# Patient Record
Sex: Female | Born: 1965 | Race: White | Hispanic: No | Smoking: Never smoker
Health system: Southern US, Community
[De-identification: ages and names within clinical notes are randomized; demographics above are authoritative.]

## PROBLEM LIST (undated history)

## (undated) DIAGNOSIS — G8929 Other chronic pain: Secondary | ICD-10-CM

## (undated) DIAGNOSIS — M545 Low back pain, unspecified: Secondary | ICD-10-CM

## (undated) DIAGNOSIS — I10 Essential (primary) hypertension: Secondary | ICD-10-CM

## (undated) DIAGNOSIS — K219 Gastro-esophageal reflux disease without esophagitis: Secondary | ICD-10-CM

## (undated) DIAGNOSIS — R112 Nausea with vomiting, unspecified: Secondary | ICD-10-CM

## (undated) DIAGNOSIS — M069 Rheumatoid arthritis, unspecified: Secondary | ICD-10-CM

## (undated) DIAGNOSIS — G43909 Migraine, unspecified, not intractable, without status migrainosus: Secondary | ICD-10-CM

## (undated) DIAGNOSIS — F419 Anxiety disorder, unspecified: Secondary | ICD-10-CM

## (undated) DIAGNOSIS — Z9889 Other specified postprocedural states: Secondary | ICD-10-CM

## (undated) HISTORY — PX: GANGLION CYST EXCISION: SHX1691

## (undated) HISTORY — PX: BACK SURGERY: SHX140

## (undated) HISTORY — PX: LUMBAR DISC SURGERY: SHX700

## (undated) HISTORY — PX: TUMOR EXCISION: SHX421

---

## 1997-04-30 ENCOUNTER — Other Ambulatory Visit: Admission: RE | Admit: 1997-04-30 | Discharge: 1997-04-30 | Payer: Self-pay | Admitting: Obstetrics and Gynecology

## 1998-07-08 ENCOUNTER — Other Ambulatory Visit: Admission: RE | Admit: 1998-07-08 | Discharge: 1998-07-08 | Payer: Self-pay | Admitting: Obstetrics and Gynecology

## 1999-08-21 ENCOUNTER — Other Ambulatory Visit: Admission: RE | Admit: 1999-08-21 | Discharge: 1999-08-21 | Payer: Self-pay | Admitting: Obstetrics and Gynecology

## 2000-04-28 ENCOUNTER — Encounter: Admission: RE | Admit: 2000-04-28 | Discharge: 2000-04-28 | Payer: Self-pay | Admitting: Family Medicine

## 2000-04-28 ENCOUNTER — Encounter: Payer: Self-pay | Admitting: Family Medicine

## 2000-09-22 ENCOUNTER — Other Ambulatory Visit: Admission: RE | Admit: 2000-09-22 | Discharge: 2000-09-22 | Payer: Self-pay | Admitting: Obstetrics and Gynecology

## 2000-11-17 ENCOUNTER — Ambulatory Visit (HOSPITAL_COMMUNITY): Admission: RE | Admit: 2000-11-17 | Discharge: 2000-11-17 | Payer: Self-pay | Admitting: Obstetrics and Gynecology

## 2000-11-17 ENCOUNTER — Encounter: Payer: Self-pay | Admitting: Obstetrics and Gynecology

## 2001-01-18 HISTORY — PX: TUBAL LIGATION: SHX77

## 2001-01-20 ENCOUNTER — Encounter: Payer: Self-pay | Admitting: Obstetrics and Gynecology

## 2001-01-20 ENCOUNTER — Ambulatory Visit (HOSPITAL_COMMUNITY): Admission: RE | Admit: 2001-01-20 | Discharge: 2001-01-20 | Payer: Self-pay | Admitting: Obstetrics and Gynecology

## 2001-02-22 ENCOUNTER — Inpatient Hospital Stay (HOSPITAL_COMMUNITY): Admission: AD | Admit: 2001-02-22 | Discharge: 2001-02-22 | Payer: Self-pay | Admitting: Obstetrics and Gynecology

## 2001-02-24 ENCOUNTER — Inpatient Hospital Stay (HOSPITAL_COMMUNITY): Admission: AD | Admit: 2001-02-24 | Discharge: 2001-02-27 | Payer: Self-pay | Admitting: Obstetrics & Gynecology

## 2001-02-24 ENCOUNTER — Encounter (INDEPENDENT_AMBULATORY_CARE_PROVIDER_SITE_OTHER): Payer: Self-pay | Admitting: Specialist

## 2001-03-31 ENCOUNTER — Ambulatory Visit (HOSPITAL_COMMUNITY): Admission: RE | Admit: 2001-03-31 | Discharge: 2001-03-31 | Payer: Self-pay | Admitting: Obstetrics & Gynecology

## 2001-08-15 ENCOUNTER — Ambulatory Visit: Admission: RE | Admit: 2001-08-15 | Discharge: 2001-08-15 | Payer: Self-pay | Admitting: Neurosurgery

## 2001-08-16 ENCOUNTER — Emergency Department (HOSPITAL_COMMUNITY): Admission: EM | Admit: 2001-08-16 | Discharge: 2001-08-16 | Payer: Self-pay | Admitting: Emergency Medicine

## 2001-08-30 ENCOUNTER — Encounter: Payer: Self-pay | Admitting: Neurosurgery

## 2001-08-30 ENCOUNTER — Inpatient Hospital Stay (HOSPITAL_COMMUNITY): Admission: RE | Admit: 2001-08-30 | Discharge: 2001-09-02 | Payer: Self-pay | Admitting: Neurosurgery

## 2001-09-27 ENCOUNTER — Encounter: Admission: RE | Admit: 2001-09-27 | Discharge: 2001-09-27 | Payer: Self-pay | Admitting: Neurosurgery

## 2001-09-27 ENCOUNTER — Encounter: Payer: Self-pay | Admitting: Neurosurgery

## 2001-11-15 ENCOUNTER — Encounter: Admission: RE | Admit: 2001-11-15 | Discharge: 2001-11-15 | Payer: Self-pay | Admitting: Neurosurgery

## 2001-11-15 ENCOUNTER — Encounter: Payer: Self-pay | Admitting: Neurosurgery

## 2003-05-30 ENCOUNTER — Encounter: Admission: RE | Admit: 2003-05-30 | Discharge: 2003-05-30 | Payer: Self-pay | Admitting: Neurosurgery

## 2003-10-04 ENCOUNTER — Emergency Department (HOSPITAL_COMMUNITY): Admission: EM | Admit: 2003-10-04 | Discharge: 2003-10-04 | Payer: Self-pay | Admitting: Emergency Medicine

## 2004-03-19 ENCOUNTER — Other Ambulatory Visit: Admission: RE | Admit: 2004-03-19 | Discharge: 2004-03-19 | Payer: Self-pay | Admitting: Obstetrics & Gynecology

## 2006-07-29 ENCOUNTER — Ambulatory Visit (HOSPITAL_COMMUNITY): Admission: RE | Admit: 2006-07-29 | Discharge: 2006-07-29 | Payer: Self-pay | Admitting: Surgery

## 2006-07-29 ENCOUNTER — Encounter (INDEPENDENT_AMBULATORY_CARE_PROVIDER_SITE_OTHER): Payer: Self-pay | Admitting: Surgery

## 2007-03-10 ENCOUNTER — Emergency Department (HOSPITAL_COMMUNITY): Admission: EM | Admit: 2007-03-10 | Discharge: 2007-03-10 | Payer: Self-pay | Admitting: Emergency Medicine

## 2007-07-07 ENCOUNTER — Ambulatory Visit (HOSPITAL_COMMUNITY): Admission: RE | Admit: 2007-07-07 | Discharge: 2007-07-07 | Payer: Self-pay | Admitting: Cardiovascular Disease

## 2007-10-22 ENCOUNTER — Encounter: Admission: RE | Admit: 2007-10-22 | Discharge: 2007-10-22 | Payer: Self-pay | Admitting: Neurosurgery

## 2009-01-16 ENCOUNTER — Encounter: Admission: RE | Admit: 2009-01-16 | Discharge: 2009-01-16 | Payer: Self-pay | Admitting: Obstetrics & Gynecology

## 2010-02-08 ENCOUNTER — Encounter: Payer: Self-pay | Admitting: Obstetrics & Gynecology

## 2010-02-08 ENCOUNTER — Encounter: Payer: Self-pay | Admitting: Neurosurgery

## 2010-06-02 NOTE — Op Note (Signed)
NAME:  Brandy Shepherd, Brandy Shepherd            ACCOUNT NO.:  [id number]   MEDICAL RECORD NO.:  [id number]          PATIENT TYPE:  AMB   LOCATION:  DAY                          FACILITY:  [facility]   PHYSICIAN:  Thomas A. Cornett, M.D.DATE OF BIRTH:  04-Jun-1965   DATE OF PROCEDURE:  07/29/2006  DATE OF DISCHARGE:                               OPERATIVE REPORT   PREOPERATIVE DIAGNOSIS:  Left axillary lipoma.   POSTOPERATIVE DIAGNOSIS:  Left axillary lipoma.   PROCEDURE:  Excision of deep left axillary lipoma measuring 6 x 60 cm.   SURGEON:  Harriette Bouillon, M.D.   ANESTHESIA:  LMA with approximately 20 mL of 0.25% Sensorcaine.   ESTIMATED BLOOD LOSS:  20 mL.   SPECIMEN:  Left axillary lipoma to pathology.   DRAINS:  None.   INDICATIONS FOR PROCEDURE:  The patient is a 45 year old female with  large left axillary lipoma.  She presents today for excision due to  discomfort.   DESCRIPTION OF PROCEDURE:  The patient brought to the operating room,  placed supine.  After induction of general anesthesia, left axilla was  prepped and draped in a sterile fashion.  A incision was made over the  roughly 6 x 6 cm lipoma.  We excised down, found a large, lobulated  fatty mass consistent with lipoma.  This excised.  It was in the deep  tissues.  We then passed it off the field.  We closed the wound in  layers using a deep layer of 3-0 Vicryl and 4-0 Monocryl in subcuticular  fashion.  Dermabond was used for dressing.  All final counts of sponge,  needle and instruments was found be correct for this portion of the  case.  The patient was then awoke, taken to recovery in satisfactory  condition.      Thomas A. Cornett, M.D.  Electronically Signed     TAC/MEDQ  D:  07/29/2006  T:  07/30/2006  Job:  [id number]

## 2010-06-05 NOTE — Op Note (Signed)
NAME:  Brandy Shepherd, Brandy Shepherd                          ACCOUNT NO.:  [id number]   MEDICAL RECORD NO.:  [id number]                   PATIENT TYPE:  INP   LOCATION:  3002                                 FACILITY:  [facility]   PHYSICIAN:  Donzetta Sprung. Wynetta Emery, M.D.                  DATE OF BIRTH:  March 04, 1965   DATE OF PROCEDURE:  08/30/2001  DATE OF DISCHARGE:                                 OPERATIVE REPORT   PREOPERATIVE DIAGNOSES:  L5-S1 recurrent ruptured disk and diskogenic  mechanical instability.   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   OPERATION PERFORMED:  Redo decompressive laminectomy, L5-S1.  Posterior  lumbar interbody fusion, L5-S1 using 10 x 24 mm tangent allograft wedges,  posterolateral arthrodesis using locally harvested autograft.  Pedicle screw  fixation, L5-S1.   SURGEON:  Donzetta Sprung. Wynetta Emery, M.D.   ASSISTANT:  Reinaldo Meeker, M.D.   ANESTHESIA:  General endotracheal.   INDICATIONS FOR PROCEDURE:  The patient is a very pleasant 45 year old  female who has had longstanding back and left leg pain with numbness and  tingling in the left foot.  The patient has had previous laminectomy at L5-  S1 for disk, did well for a period of time postoperatively.  Over the last  several months has had progressively worsening left leg pain radiating to  the S1 distribution with back pain that was mechanical in nature worse when  she went from a lying position to the sitting position.  Previous __________  revealed ruptured disk and degenerated disk at L5-S1.  Patient has failed  conservative treatment and was recommended decompressive laminectomy and  fusion.  Discussed and explained the risks and benefits of surgery with her.  She understands and agreed to proceed.   DESCRIPTION OF PROCEDURE:  Patient placed supine, induced under general  anesthesia __________.  Back was prepped and draped in the usual sterile  fashion.  Her old  incision was opened up.  Bovie electrocautery was used to  __________ subperiosteal dissection carried out to lamina.  Intraoperative x-  ray confirmed observation of pedicles of L4 and L5.  The incision was  extended inferiorly and again the exposure was extended laterally, exposing  the TPs of L5 and S1.  Self-retaining retractor was placed.  Then using  Leksell rongeur spinous process of L5 was removed and complete laminectomy  and medial facetectomy was performed on the right side at L5-S1, as well as  removing the ligamentum flavum in piecemeal fashion exposing the thecal sac  and the right S1 nerve root.  Disk space was coagulated.  Attention taken to  freeing up the scar tissue leftward.  Using a 4 Penfield scar tissue was  freed up in the central lamina and laminectomy was completed, medial  facetectomy was completed on the patient's left side.  There was a moderate  amount of scar tissue  superiorly.  The disk space appeared to be competent  and __________  very large disk rupture appreciated on the left S1 nerve  root.  This was teased away with a 4 Penfield and the foramen of the S1  nerve root was opened up widely.  Then using a D'Errico nerve root  retractor, the S1 nerve root was reflected medially and annulotomy was made  and fibrinous tissue removed from the left side.  This was cleaned out.  An  11 distractor was placed.  Fluoroscopy confirmed this to be a good size and  10 x 24 mm tangent allograft wedges were selected.  Then with the 11  distractor in place on the left side, the right disk space was cleaned out.  Annulotomy was made.  Disk was adequately cleaned out.  Endplates were  scraped down.  Then using a size 10 cutter and chisel down the disk space  was prepared to receive the allograft after the chisel was inserted and the  end plates were prepared.  Fluoroscopy confirmed depth.  A 10 x 24 mm  tangent allograft was inserted on the patient's right side approximately  10  mm posterior vertebral __________ .  Fluoroscopy confirmed good position.  The distractor was removed.  The interspace was cleaned out from the left  side.  Again cut with chisel with a 10 cutter and chisel.  Central end  plates were scraped off with a 0 Epstein curet.  Then local autograft was  packed against the allograft on the right side and a 10 x 24 mm tangent  allograft was inserted on the patient's left side.   Then attention was taken __________ .  Using fluoroscopy, the pedicle was  identified and also on direct inspection with an angled __________ ,  identifying the medial and inferior borders of the pedicle.  Pedicle holes  were cannulated with a high speed drill, then cannulated further with the  awl, tapped with a 5/5 tap and six 5 x 45 pedicle screws inserted at L5  bilaterally.  Fluoroscopy confirming depth and trajectory.  Then at S1 this  procedure was repeated with six 5 x 35 pedicle screws inserted at S1.  All  pedicles were probed on 360-degree orientation at each step along the way  and noted to be competent and all nerve roots and pedicles were probed from  within the canal and noted to have no violation.  Then the wound was  copiously irrigated.  Decortication was proceeded along __________ of L5 and  S1 medial and lateral facet complexes.  The remainder of the locally  harvested autograft was packed in the lateral gutters.  A 40 mm rod was  selected and inserted tightened down at S1 compressed from L5 down to S1 and  then final tightened there.  Then again meticulous hemostasis was obtained.  Copious irrigation was performed.  Gelfoam was laid atop of the dura.  Fascia was reapproximated after a medium Hemovac drain was placed with 0  interrupted Vicryl.  Subcutaneous tissues closed with two interrupted  Vicryl.  Skin closed with running 4-0 subcuticular __________ Steri-Strips applied.  The patient was taken to the recovery room in stable condition.  At the  end of the case, needle count and sponge count correct.  Jillyn Hidden P. Wynetta Emery, M.D.    GPC/MEDQ  D:  08/30/2001  T:  09/01/2001  Job:  [id number]

## 2010-06-05 NOTE — Discharge Summary (Signed)
   NAME:  SAVINA, OLSHEFSKI                          ACCOUNT NO.:  [id number]   MEDICAL RECORD NO.:  [id number]                   PATIENT TYPE:  INP   LOCATION:  3002                                 FACILITY:  [facility]   PHYSICIAN:  Donzetta Sprung. Wynetta Emery, M.D.                  DATE OF BIRTH:  06/17/1965   DATE OF ADMISSION:  08/30/2001  DATE OF DISCHARGE:  09/02/2001                                 DISCHARGE SUMMARY   ADMITTING DIAGNOSIS:  Recurrent ruptured disk and ____________ Campylobacter  in L5-S1.   PROCEDURE:  Decompressive laminectomy redo and lumbar fusion L5-S1.   SURGEON:  Donzetta Sprung. Wynetta Emery, M.D.   ASSISTANT:  ____________ .   HISTORY OF PRESENT ILLNESS AND HOSPITAL COURSE:  The patient is a very  pleasant 45 year old female who was admitted to the hospital and seen me and  underwent the aforementioned procedure.  Postop the patient did very well on  the floor was afebrile with stable vital signs mobilizing on the first day  with physical therapy, remained afebrile, Hemovac was taken out on day #2,  did well with physical therapy, Foley was taken out and the patient was  voiding spontaneously at the time of discharge.  Postop day #3 the patient  was ambulating and voiding spontaneously, wound was clean and dry, leg pain  was gone and her incision was clean and dry and so she was discharged with  scheduled followup 2 weeks.                                               Jillyn Hidden P. Wynetta Emery, M.D.    GPC/MEDQ  D:  10/19/2001  T:  10/19/2001  Job:  [id number]

## 2010-06-05 NOTE — Op Note (Signed)
[facility]  Patient:    Brandy Shepherd, Brandy Shepherd Visit Number: [id number] MRN: [id number]          Service Type: DSU Location: [facility] Attending Physician:  Mickle Mallory Dictated by:   Gerrit Friends. Aldona Bar, M.D. Proc. Date: 03/31/01 Admit Date:  03/31/2001                             Operative Report  PREOPERATIVE DIAGNOSIS:       Desire for permanent elective sterilization.  POSTOPERATIVE DIAGNOSIS:      Desire for permanent elective sterilization.  PROCEDURE:                    Fiberscopic tubal coagulation for permanent elective sterilization.  SURGEON:                      Gerrit Friends. Aldona Bar, M.D.  ANESTHESIA:                   General endotracheal.  HISTORY:                      This 45 year old who delivered approximately five weeks ago antenatally requested permanent elective sterilization procedure.  When she was delivered, she had been induced for mild preeclampsia, and it was felt best, because of her persistently elevated blood pressures postpartum, not to carry out her tubal sterilization procedure at that time.  She returned to the office for her follow-up checkup approximately one week ago, and she was still interested in such procedure.  She is taken to the operating room now according to her wishes for such procedure.  She is aware that this procedure is permanent but, unfortunately, is not absolutely perfect, and subsequent pregnancy can result.  According to her wishes, she is now taken to the operating room for such procedure.  DESCRIPTION OF PROCEDURE:     The patient was taken to the operating room where, after the satisfactory induction of general endotracheal anesthesia, she was prepped and draped, having been placed in the short Allen stirrups in the modified lithotomy position.  She was prepped and draped in the usual fashion.  The bladder was drained of clear urine with a red rubber catheter in in-and-out fashion.  A Hulka  tenaculum was placed on the cervix for uterine manipulation during the procedure.  At this time, laparoscopy was begun.  A 1 cm subumbilical transverse skin incision was made and, with minimal difficulty, the large trocar and sleeve were introduced.  The large trocar was removed and a laparoscope was placed through the large sleeve, with good visualization of the intra-abdominal structures.  At this time, pneumoperitoneum was created with approximately 3 L of carbon dioxide gas.  At this time, through a 5 mm suprapubic midline transverse skin incision, an accessory trocar and sleeve were introduced without difficulty under direct visualization.  The accessory trocar was removed and, through the accessory sleeve, the bipolar coagulating instrument, appropriately tested, was introduced.  The right fallopian tube was identified, traced out to the fimbriated end with positive identification and, in the midportion of the right fallopian tube, an area measuring approximately 2 cm was adequately coagulated.  A similar procedure was carried out on the left fallopian tube. Both ovaries appeared normal, as did the uterus, cul-de-sac and dome of the bladder.   The appendix was not visualized secondary to being covered by omentum.  The abdomen in general appeared normal.  The liver edge was not visualized well, again because of the omentum covering over this area.  Once the tubes had been adequately coagulated, the procedure was felt to be complete and was terminated.  The accessory sleeve was removed under direct visualization after the bipolar coagulating instrument was removed.  At this time, pneumoperitoneum was reduced and the large sleeve was removed.  The skin incisions were closed with 4-0 Vicryl in an interrupted subcuticular fashion. Band-aids were applied.  The Hulka tenaculum was removed from the cervix and the patient at this time was transported to the recovery room in  satisfactory condition, having tolerated the procedure well.  ESTIMATED BLOOD LOSS:         Negligible.  COUNTS:                       Correct x2.  PATHOLOGIC SPECIMENS:         None.  DISPOSITION:                  The patient will be observed and discharged to home.  She will be given an instruction sheet at the time of discharge with all specific instructions enumerated.  She will be given a prescription for Tylox to use 1-2 every 4-6 hours as needed for severe pain and Compazine rectal suppositories, 25 mg, one per rectum every 6-8 hours as needed for severe nausea and vomiting.  CONDITION ON ARRIVAL IN THE RECOVERY ROOM:         Satisfactory. Dictated by:   Gerrit Friends. Aldona Bar, M.D. Attending Physician:  Mickle Mallory DD:  03/31/01 TD:  04/01/01 Job: [id number]

## 2010-09-23 ENCOUNTER — Other Ambulatory Visit: Payer: Self-pay | Admitting: Obstetrics & Gynecology

## 2010-09-29 ENCOUNTER — Other Ambulatory Visit: Payer: Self-pay | Admitting: Obstetrics & Gynecology

## 2010-09-29 DIAGNOSIS — R928 Other abnormal and inconclusive findings on diagnostic imaging of breast: Secondary | ICD-10-CM

## 2010-10-05 ENCOUNTER — Ambulatory Visit
Admission: RE | Admit: 2010-10-05 | Discharge: 2010-10-05 | Disposition: A | Discharge status: Home / Self Care | Payer: 59 | Source: Ambulatory Visit | Attending: Obstetrics & Gynecology | Admitting: Obstetrics & Gynecology

## 2010-10-05 DIAGNOSIS — R928 Other abnormal and inconclusive findings on diagnostic imaging of breast: Secondary | ICD-10-CM

## 2010-10-09 LAB — DIFFERENTIAL
Basophils Relative: 1
Eosinophils Absolute: 0.1
Eosinophils Relative: 1
Lymphocytes Relative: 26
Lymphs Abs: 2.4

## 2010-10-09 LAB — I-STAT 8, (EC8 V) (CONVERTED LAB)
Acid-base deficit: 3 — ABNORMAL HIGH
BUN: 7
HCT: 45
Hemoglobin: 15.3 — ABNORMAL HIGH
Operator id: 288331
Potassium: 4.2
pH, Ven: 7.348 — ABNORMAL HIGH

## 2010-10-09 LAB — CBC
HCT: 39.3
MCHC: 34.4
MCV: 89.8
Platelets: 285
RDW: 13.2

## 2010-10-09 LAB — POCT CARDIAC MARKERS
Myoglobin, poc: 313
Troponin i, poc: 0.05

## 2010-10-09 LAB — POCT I-STAT CREATININE
Creatinine, Ser: 1
Operator id: 288331

## 2010-11-03 LAB — CBC
Hemoglobin: 13.7
MCV: 87.9
Platelets: 292
RBC: 4.54
RDW: 13.3
WBC: 7.6

## 2010-11-03 LAB — DIFFERENTIAL
Basophils Absolute: 0.1
Eosinophils Absolute: 0.1
Eosinophils Relative: 1
Lymphocytes Relative: 38
Monocytes Relative: 7

## 2010-11-03 LAB — BASIC METABOLIC PANEL
Creatinine, Ser: 0.65
Glucose, Bld: 92

## 2011-09-03 ENCOUNTER — Ambulatory Visit
Admission: RE | Admit: 2011-09-03 | Discharge: 2011-09-03 | Disposition: A | Discharge status: Home / Self Care | Payer: 59 | Source: Ambulatory Visit | Attending: Orthopedic Surgery | Admitting: Orthopedic Surgery

## 2011-09-03 ENCOUNTER — Other Ambulatory Visit: Payer: Self-pay | Admitting: Orthopedic Surgery

## 2011-09-03 DIAGNOSIS — S62102A Fracture of unspecified carpal bone, left wrist, initial encounter for closed fracture: Secondary | ICD-10-CM

## 2012-12-01 ENCOUNTER — Other Ambulatory Visit: Payer: Self-pay | Admitting: Family

## 2012-12-01 DIAGNOSIS — I728 Aneurysm of other specified arteries: Secondary | ICD-10-CM

## 2012-12-20 ENCOUNTER — Ambulatory Visit
Admission: RE | Admit: 2012-12-20 | Discharge: 2012-12-20 | Disposition: A | Discharge status: Home / Self Care | Payer: 59 | Source: Ambulatory Visit | Attending: Family | Admitting: Family

## 2012-12-20 DIAGNOSIS — I728 Aneurysm of other specified arteries: Secondary | ICD-10-CM

## 2012-12-20 HISTORY — DX: Gastro-esophageal reflux disease without esophagitis: K21.9

## 2012-12-20 HISTORY — DX: Essential (primary) hypertension: I10

## 2012-12-22 ENCOUNTER — Other Ambulatory Visit (HOSPITAL_COMMUNITY): Payer: Self-pay | Admitting: Interventional Radiology

## 2012-12-22 DIAGNOSIS — I728 Aneurysm of other specified arteries: Secondary | ICD-10-CM

## 2012-12-27 ENCOUNTER — Telehealth: Payer: Self-pay | Admitting: Emergency Medicine

## 2012-12-27 ENCOUNTER — Other Ambulatory Visit (HOSPITAL_COMMUNITY): Payer: Self-pay | Admitting: Radiology

## 2012-12-27 ENCOUNTER — Encounter: Payer: Self-pay | Admitting: Emergency Medicine

## 2012-12-27 NOTE — Telephone Encounter (Signed)
Pt called and needed letter for work for her procedure on 01-05-13. Faxed to [phone]

## 2013-01-05 ENCOUNTER — Encounter (HOSPITAL_COMMUNITY): Payer: 59 | Admitting: Anesthesiology

## 2013-01-05 ENCOUNTER — Other Ambulatory Visit (HOSPITAL_COMMUNITY): Payer: Self-pay | Admitting: Interventional Radiology

## 2013-01-05 ENCOUNTER — Encounter (HOSPITAL_COMMUNITY): Payer: Self-pay

## 2013-01-05 ENCOUNTER — Ambulatory Visit (HOSPITAL_COMMUNITY)
Admission: RE | Admit: 2013-01-05 | Discharge: 2013-01-06 | Disposition: A | Discharge status: Home / Self Care | Payer: 59 | Source: Ambulatory Visit | Attending: Interventional Radiology | Admitting: Interventional Radiology

## 2013-01-05 ENCOUNTER — Encounter (HOSPITAL_COMMUNITY): Payer: Self-pay | Admitting: Anesthesiology

## 2013-01-05 VITALS — BP 123/66 | HR 88 | Temp 97.7°F | Resp 18 | Ht 67.5 in | Wt 270.6 lb

## 2013-01-05 DIAGNOSIS — I728 Aneurysm of other specified arteries: Secondary | ICD-10-CM

## 2013-01-05 DIAGNOSIS — R112 Nausea with vomiting, unspecified: Secondary | ICD-10-CM | POA: Insufficient documentation

## 2013-01-05 DIAGNOSIS — K219 Gastro-esophageal reflux disease without esophagitis: Secondary | ICD-10-CM | POA: Insufficient documentation

## 2013-01-05 DIAGNOSIS — I10 Essential (primary) hypertension: Secondary | ICD-10-CM | POA: Insufficient documentation

## 2013-01-05 DIAGNOSIS — F411 Generalized anxiety disorder: Secondary | ICD-10-CM | POA: Insufficient documentation

## 2013-01-05 DIAGNOSIS — M069 Rheumatoid arthritis, unspecified: Secondary | ICD-10-CM | POA: Insufficient documentation

## 2013-01-05 HISTORY — PX: ANEURYSM COILING: SHX5349

## 2013-01-05 HISTORY — DX: Other specified postprocedural states: Z98.890

## 2013-01-05 HISTORY — DX: Migraine, unspecified, not intractable, without status migrainosus: G43.909

## 2013-01-05 HISTORY — DX: Low back pain: M54.5

## 2013-01-05 HISTORY — DX: Rheumatoid arthritis, unspecified: M06.9

## 2013-01-05 HISTORY — DX: Low back pain, unspecified: M54.50

## 2013-01-05 HISTORY — DX: Other chronic pain: G89.29

## 2013-01-05 HISTORY — DX: Other specified postprocedural states: R11.2

## 2013-01-05 HISTORY — DX: Anxiety disorder, unspecified: F41.9

## 2013-01-05 LAB — PREPARE RBC (CROSSMATCH)

## 2013-01-05 LAB — CBC WITH DIFFERENTIAL/PLATELET
Basophils Relative: 1 % (ref 0–1)
Eosinophils Relative: 4 % (ref 0–5)
Hemoglobin: 12.8 g/dL (ref 12.0–15.0)
Lymphocytes Relative: 50 % — ABNORMAL HIGH (ref 12–46)
Lymphs Abs: 4 10*3/uL (ref 0.7–4.0)
MCHC: 34.3 g/dL (ref 30.0–36.0)
Monocytes Relative: 9 % (ref 3–12)
Neutro Abs: 2.9 10*3/uL (ref 1.7–7.7)
Neutrophils Relative %: 37 % — ABNORMAL LOW (ref 43–77)
RBC: 3.91 MIL/uL (ref 3.87–5.11)
WBC: 8 10*3/uL (ref 4.0–10.5)

## 2013-01-05 LAB — ABO/RH: ABO/RH(D): A POS

## 2013-01-05 LAB — BASIC METABOLIC PANEL
Creatinine, Ser: 0.98 mg/dL (ref 0.50–1.10)
GFR calc Af Amer: 80 mL/min — ABNORMAL LOW (ref >90)
GFR calc non Af Amer: 69 mL/min — ABNORMAL LOW (ref >90)
Potassium: 3.6 mEq/L (ref 3.5–5.1)
Sodium: 136 mEq/L (ref 135–145)

## 2013-01-05 LAB — APTT: aPTT: 30 seconds (ref 24–37)

## 2013-01-05 LAB — PROTIME-INR: INR: 0.98 (ref 0.00–1.49)

## 2013-01-05 MED ORDER — FENTANYL CITRATE 0.05 MG/ML IJ SOLN
INTRAMUSCULAR | Status: AC
  Filled 2013-01-05: qty 4

## 2013-01-05 MED ORDER — ONDANSETRON HCL 4 MG/2ML IJ SOLN: 4.0000 mg | Freq: Three times a day (TID) | INTRAMUSCULAR | Status: DC | PRN

## 2013-01-05 MED ORDER — SODIUM CHLORIDE 0.9 % IV BOLUS (SEPSIS)
INTRAVENOUS | Status: AC | PRN
  Administered 2013-01-05: 250 mL via INTRAVENOUS

## 2013-01-05 MED ORDER — ONDANSETRON HCL 4 MG/2ML IJ SOLN
INTRAMUSCULAR | Status: AC
  Filled 2013-01-05: qty 2

## 2013-01-05 MED ORDER — SUCCINYLCHOLINE CHLORIDE 20 MG/ML IJ SOLN
INTRAMUSCULAR | Status: DC | PRN
  Administered 2013-01-05: 120 mg via INTRAVENOUS

## 2013-01-05 MED ORDER — MIDAZOLAM HCL 2 MG/2ML IJ SOLN
INTRAMUSCULAR | Status: AC
  Filled 2013-01-05: qty 2

## 2013-01-05 MED ORDER — HEPARIN SODIUM (PORCINE) 1000 UNIT/ML IJ SOLN
INTRAMUSCULAR | Status: DC | PRN
  Administered 2013-01-05: 3000 [IU] via INTRAVENOUS

## 2013-01-05 MED ORDER — PROMETHAZINE HCL 25 MG PO TABS: 25.0000 mg | ORAL_TABLET | Freq: Four times a day (QID) | ORAL | Status: DC | PRN

## 2013-01-05 MED ORDER — LISINOPRIL-HYDROCHLOROTHIAZIDE 10-12.5 MG PO TABS: 1.0000 | ORAL_TABLET | Freq: Every day | ORAL | Status: DC

## 2013-01-05 MED ORDER — DIPHENHYDRAMINE HCL 12.5 MG/5ML PO ELIX
12.5000 mg | ORAL_SOLUTION | Freq: Four times a day (QID) | ORAL | Status: DC | PRN
  Filled 2013-01-05: qty 5

## 2013-01-05 MED ORDER — DEXTROSE 5 % IV SOLN
3.0000 g | Freq: Once | INTRAVENOUS | Status: AC
  Administered 2013-01-05: 3 g via INTRAVENOUS
  Filled 2013-01-05: qty 3000

## 2013-01-05 MED ORDER — SODIUM CHLORIDE 0.9 % IV SOLN
INTRAVENOUS | Status: AC
  Administered 2013-01-05 – 2013-01-06 (×2): via INTRAVENOUS

## 2013-01-05 MED ORDER — LACTATED RINGERS IV SOLN
INTRAVENOUS | Status: DC | PRN
  Administered 2013-01-05 (×2): via INTRAVENOUS

## 2013-01-05 MED ORDER — HYDROMORPHONE HCL PF 1 MG/ML IJ SOLN
INTRAMUSCULAR | Status: AC
  Filled 2013-01-05: qty 2

## 2013-01-05 MED ORDER — PROPOFOL 10 MG/ML IV BOLUS
INTRAVENOUS | Status: DC | PRN
  Administered 2013-01-05: 170 mg via INTRAVENOUS

## 2013-01-05 MED ORDER — PROMETHAZINE HCL 25 MG RE SUPP: 25.0000 mg | Freq: Three times a day (TID) | RECTAL | Status: DC | PRN

## 2013-01-05 MED ORDER — DEXAMETHASONE SODIUM PHOSPHATE 4 MG/ML IJ SOLN
INTRAMUSCULAR | Status: DC | PRN
  Administered 2013-01-05: 8 mg via INTRAVENOUS

## 2013-01-05 MED ORDER — SODIUM CHLORIDE 0.9 % IJ SOLN: 9.0000 mL | INTRAMUSCULAR | Status: DC | PRN

## 2013-01-05 MED ORDER — MENINGOCOCCAL A C Y&W-135 OLIG IM SOLR: 0.5000 mL | Freq: Once | INTRAMUSCULAR | Status: DC

## 2013-01-05 MED ORDER — HYDROCHLOROTHIAZIDE 12.5 MG PO CAPS
12.5000 mg | ORAL_CAPSULE | Freq: Every day | ORAL | Status: DC
  Administered 2013-01-05 – 2013-01-06 (×2): 12.5 mg via ORAL
  Filled 2013-01-05 (×2): qty 1

## 2013-01-05 MED ORDER — PROPOFOL INFUSION 10 MG/ML OPTIME
INTRAVENOUS | Status: DC | PRN
  Administered 2013-01-05: 75 ug/kg/min via INTRAVENOUS

## 2013-01-05 MED ORDER — SODIUM CHLORIDE 0.9 % IV SOLN
Freq: Once | INTRAVENOUS | Status: AC
  Administered 2013-01-05: 07:00:00 via INTRAVENOUS

## 2013-01-05 MED ORDER — ROCURONIUM BROMIDE 100 MG/10ML IV SOLN
INTRAVENOUS | Status: DC | PRN
  Administered 2013-01-05: 20 mg via INTRAVENOUS

## 2013-01-05 MED ORDER — PROMETHAZINE HCL 25 MG PO TABS: 25.0000 mg | ORAL_TABLET | Freq: Three times a day (TID) | ORAL | Status: DC | PRN

## 2013-01-05 MED ORDER — LISINOPRIL 10 MG PO TABS
10.0000 mg | ORAL_TABLET | Freq: Every day | ORAL | Status: DC
  Administered 2013-01-06: 10 mg via ORAL
  Filled 2013-01-05 (×2): qty 1

## 2013-01-05 MED ORDER — TAPENTADOL HCL ER 250 MG PO TB12: 250.0000 mg | ORAL_TABLET | Freq: Two times a day (BID) | ORAL | Status: DC

## 2013-01-05 MED ORDER — FENTANYL CITRATE 0.05 MG/ML IJ SOLN
INTRAMUSCULAR | Status: DC | PRN
  Administered 2013-01-05 (×2): 50 ug via INTRAVENOUS

## 2013-01-05 MED ORDER — IOHEXOL 300 MG/ML  SOLN
150.0000 mL | Freq: Once | INTRAMUSCULAR | Status: AC | PRN
  Administered 2013-01-05: 180 mL via INTRA_ARTERIAL

## 2013-01-05 MED ORDER — METOCLOPRAMIDE HCL 10 MG PO TABS: 10.0000 mg | ORAL_TABLET | Freq: Three times a day (TID) | ORAL | Status: DC | PRN

## 2013-01-05 MED ORDER — OXYCODONE HCL ER 40 MG PO T12A
40.0000 mg | EXTENDED_RELEASE_TABLET | Freq: Two times a day (BID) | ORAL | Status: DC
  Administered 2013-01-05 – 2013-01-06 (×2): 40 mg via ORAL
  Filled 2013-01-05 (×2): qty 1

## 2013-01-05 MED ORDER — PROMETHAZINE HCL 25 MG PO TABS
25.0000 mg | ORAL_TABLET | Freq: Four times a day (QID) | ORAL | Status: DC | PRN
  Administered 2013-01-05 – 2013-01-06 (×2): 25 mg via ORAL
  Filled 2013-01-05 (×2): qty 1

## 2013-01-05 MED ORDER — HEPARIN SODIUM (PORCINE) 1000 UNIT/ML IJ SOLN
INTRAMUSCULAR | Status: AC
  Filled 2013-01-05: qty 1

## 2013-01-05 MED ORDER — METRONIDAZOLE IN NACL 5-0.79 MG/ML-% IV SOLN
500.0000 mg | Freq: Three times a day (TID) | INTRAVENOUS | Status: DC
  Filled 2013-01-05 (×3): qty 100

## 2013-01-05 MED ORDER — MIDAZOLAM HCL 2 MG/2ML IJ SOLN
INTRAMUSCULAR | Status: AC
  Filled 2013-01-05: qty 4

## 2013-01-05 MED ORDER — HYDROMORPHONE HCL PF 1 MG/ML IJ SOLN
INTRAMUSCULAR | Status: AC
  Filled 2013-01-05: qty 3

## 2013-01-05 MED ORDER — PANTOPRAZOLE SODIUM 40 MG PO TBEC
40.0000 mg | DELAYED_RELEASE_TABLET | Freq: Every day | ORAL | Status: DC
  Administered 2013-01-05 – 2013-01-06 (×2): 40 mg via ORAL
  Filled 2013-01-05 (×2): qty 1

## 2013-01-05 MED ORDER — DOCUSATE SODIUM 100 MG PO CAPS: 100.0000 mg | ORAL_CAPSULE | Freq: Two times a day (BID) | ORAL | Status: DC

## 2013-01-05 MED ORDER — METHOTREXATE 2.5 MG PO TABS: 10.0000 mg | ORAL_TABLET | ORAL | Status: DC

## 2013-01-05 MED ORDER — ENOXAPARIN SODIUM 40 MG/0.4ML ~~LOC~~ SOLN
40.0000 mg | SUBCUTANEOUS | Status: DC
  Administered 2013-01-05: 40 mg via SUBCUTANEOUS
  Filled 2013-01-05 (×2): qty 0.4

## 2013-01-05 MED ORDER — SODIUM CHLORIDE 0.9 % IV SOLN: INTRAVENOUS | Status: DC

## 2013-01-05 MED ORDER — KETOROLAC TROMETHAMINE 30 MG/ML IJ SOLN: 30.0000 mg | Freq: Four times a day (QID) | INTRAMUSCULAR | Status: DC

## 2013-01-05 MED ORDER — ARTIFICIAL TEARS OP OINT
TOPICAL_OINTMENT | OPHTHALMIC | Status: DC | PRN
  Administered 2013-01-05: 1 via OPHTHALMIC

## 2013-01-05 MED ORDER — GLYCOPYRROLATE 0.2 MG/ML IJ SOLN
INTRAMUSCULAR | Status: DC | PRN
  Administered 2013-01-05: 0.4 mg via INTRAVENOUS

## 2013-01-05 MED ORDER — DOCUSATE SODIUM 100 MG PO CAPS
100.0000 mg | ORAL_CAPSULE | Freq: Two times a day (BID) | ORAL | Status: DC
  Administered 2013-01-05 – 2013-01-06 (×2): 100 mg via ORAL
  Filled 2013-01-05 (×3): qty 1

## 2013-01-05 MED ORDER — HYDROMORPHONE HCL PF 1 MG/ML IJ SOLN
0.2500 mg | INTRAMUSCULAR | Status: DC | PRN
  Administered 2013-01-05 (×2): 0.5 mg via INTRAVENOUS

## 2013-01-05 MED ORDER — MIDAZOLAM HCL 2 MG/2ML IJ SOLN
INTRAMUSCULAR | Status: AC | PRN
  Administered 2013-01-05 (×3): 1 mg via INTRAVENOUS
  Administered 2013-01-05 (×2): 2 mg via INTRAVENOUS
  Administered 2013-01-05 (×3): 1 mg via INTRAVENOUS

## 2013-01-05 MED ORDER — ONDANSETRON 8 MG/NS 50 ML IVPB
8.0000 mg | Freq: Once | INTRAVENOUS | Status: AC
  Administered 2013-01-05: 8 mg via INTRAVENOUS
  Filled 2013-01-05 (×2): qty 8

## 2013-01-05 MED ORDER — ONDANSETRON HCL 4 MG/2ML IJ SOLN
INTRAMUSCULAR | Status: AC | PRN
  Administered 2013-01-05: 4 mg via INTRAVENOUS

## 2013-01-05 MED ORDER — FENTANYL CITRATE 0.05 MG/ML IJ SOLN
INTRAMUSCULAR | Status: AC
  Filled 2013-01-05: qty 2

## 2013-01-05 MED ORDER — HYDROMORPHONE 0.3 MG/ML IV SOLN
INTRAVENOUS | Status: AC
  Filled 2013-01-05: qty 25

## 2013-01-05 MED ORDER — HAEMOPHILUS B POLYSAC CONJ VAC IM SOLR
0.5000 mL | Freq: Once | INTRAMUSCULAR | Status: AC
  Administered 2013-01-06: 0.5 mL via INTRAMUSCULAR
  Filled 2013-01-05: qty 0.5

## 2013-01-05 MED ORDER — MIDAZOLAM HCL 2 MG/2ML IJ SOLN
INTRAMUSCULAR | Status: AC
  Filled 2013-01-05: qty 6

## 2013-01-05 MED ORDER — HYDROMORPHONE 0.3 MG/ML IV SOLN
INTRAVENOUS | Status: DC
  Administered 2013-01-05 – 2013-01-06 (×2): via INTRAVENOUS
  Filled 2013-01-05 (×2): qty 25

## 2013-01-05 MED ORDER — ALBUMIN HUMAN 5 % IV SOLN
INTRAVENOUS | Status: DC | PRN
  Administered 2013-01-05: 12:00:00 via INTRAVENOUS

## 2013-01-05 MED ORDER — PIPERACILLIN-TAZOBACTAM 3.375 G IVPB
3.3750 g | Freq: Three times a day (TID) | INTRAVENOUS | Status: DC
  Administered 2013-01-05 – 2013-01-06 (×3): 3.375 g via INTRAVENOUS
  Filled 2013-01-05 (×5): qty 50

## 2013-01-05 MED ORDER — FOLIC ACID 1 MG PO TABS
1.0000 mg | ORAL_TABLET | Freq: Every day | ORAL | Status: DC
  Administered 2013-01-05 – 2013-01-06 (×2): 1 mg via ORAL
  Filled 2013-01-05 (×2): qty 1

## 2013-01-05 MED ORDER — HYDROMORPHONE HCL PF 1 MG/ML IJ SOLN
INTRAMUSCULAR | Status: AC | PRN
  Administered 2013-01-05 (×4): 1 mg

## 2013-01-05 MED ORDER — FENTANYL CITRATE 0.05 MG/ML IJ SOLN
INTRAMUSCULAR | Status: AC | PRN
  Administered 2013-01-05 (×6): 50 ug via INTRAVENOUS

## 2013-01-05 MED ORDER — LIDOCAINE HCL (CARDIAC) 20 MG/ML IV SOLN
INTRAVENOUS | Status: DC | PRN
  Administered 2013-01-05: 50 mg via INTRAVENOUS

## 2013-01-05 MED ORDER — NALOXONE HCL 0.4 MG/ML IJ SOLN: 0.4000 mg | INTRAMUSCULAR | Status: DC | PRN

## 2013-01-05 MED ORDER — KETOROLAC TROMETHAMINE 30 MG/ML IJ SOLN
30.0000 mg | Freq: Four times a day (QID) | INTRAMUSCULAR | Status: DC
  Administered 2013-01-05 – 2013-01-06 (×3): 30 mg via INTRAVENOUS
  Filled 2013-01-05 (×7): qty 1

## 2013-01-05 MED ORDER — NEOSTIGMINE METHYLSULFATE 1 MG/ML IJ SOLN
INTRAMUSCULAR | Status: DC | PRN
  Administered 2013-01-05: 3 mg via INTRAVENOUS

## 2013-01-05 MED ORDER — DIPHENHYDRAMINE HCL 50 MG/ML IJ SOLN: 12.5000 mg | Freq: Four times a day (QID) | INTRAMUSCULAR | Status: DC | PRN

## 2013-01-05 MED ORDER — SODIUM CHLORIDE 0.9 % IV SOLN
INTRAVENOUS | Status: DC | PRN
  Administered 2013-01-05: 11:00:00 via INTRAVENOUS

## 2013-01-05 MED ORDER — FLUOXETINE HCL 20 MG PO CAPS
40.0000 mg | ORAL_CAPSULE | Freq: Every day | ORAL | Status: DC
  Administered 2013-01-06: 40 mg via ORAL
  Filled 2013-01-05 (×2): qty 2

## 2013-01-05 NOTE — Progress Notes (Signed)
Transferred to 6 E 12 . Report to Sharen Heck RN

## 2013-01-05 NOTE — ED Notes (Signed)
Pt c/o of back and abd pain 10/10. Additional meds given and sponge placed underneath lower back

## 2013-01-05 NOTE — Progress Notes (Signed)
Foley d/c. Patient tolerated well.

## 2013-01-05 NOTE — Procedures (Signed)
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   Complications: Discomfort during procedure requiring anesthesia assistance for sedation.  Recommendations: - Bedrest - Pain control and hydration until tolerating PO - IV abx - Vaccinate for encapsulated organisms   Signed,  Sterling Big, MD [facility]

## 2013-01-05 NOTE — Progress Notes (Signed)
Interventional Radiology Post Op Note  Date: 01/05/13 HPI: 47 yo female with enlarging distal splenic artery aneurysm now POD#0 s/p sandwich coil embolization of the aneurysm and distal splenic artery. Subjective:  Recovered well from anesthesia.  She continues to have back pain and mild LUQ pain.  Denies current N/V or severe pain.  Groin is asymptomatic.  No fever or chills.  Objective: Filed Vitals:   01/05/13 1709  BP: 127/66  Pulse: 97  Temp: 98.7 F (37.1 C)  Resp: 20   Abd: Soft, minimally TTP LUQ, no peritoneal signs Right groin: Soft, minimally TTP, no hematoma, dressing C/D/I Pulses: Intact and symmetric distally  Assessment & Plan: Doing well POD#0 s/p distal coil embolization of splenic artery. - Bedrest over at 19:00, may then DC foley and AAT - ADAT - Continue dilaudid PCA tonight, attempt to convert to oral regimen tomorrow - Toradol Q6hrs as ordered - SCDs now, lovenox at 22:00 - IV zosyn until D/C - Meningococcal and H. Flu vaccines ordered for am - Labs in am - I am optimistic she may be ready for DC tomorrow - Pt to be DC'd on Levaquin 500 mg Daily x 7 days - F/U with Dr. Archer Asa in 2 weeks  Signed,  Sterling Big, MD [facility] [phone]

## 2013-01-05 NOTE — ED Notes (Signed)
Logan CRNA assumed care of pt

## 2013-01-05 NOTE — Anesthesia Preprocedure Evaluation (Addendum)
Anesthesia Evaluation  Patient identified by MRN, date of birth, ID band Patient awake and Patient confused    Reviewed: Allergy & Precautions, H&P , NPO status , Patient's Chart, lab work & pertinent test results, reviewed documented beta blocker date and time   History of Anesthesia Complications (+) PONV  Airway Mallampati: II TM Distance: >3 FB Neck ROM: Full  Mouth opening: Limited Mouth Opening  Dental  (+) Teeth Intact   Pulmonary    Pulmonary exam normal       Cardiovascular hypertension,     Neuro/Psych  Headaches,    GI/Hepatic GERD-  ,  Endo/Other    Renal/GU      Musculoskeletal   Abdominal Normal abdominal exam  (+)   Peds  Hematology   Anesthesia Other Findings   Reproductive/Obstetrics                          Anesthesia Physical Anesthesia Plan  ASA: III  Anesthesia Plan: MAC and General   Post-op Pain Management:    Induction: Intravenous  Airway Management Planned: Mask and Oral ETT  Additional Equipment:   Intra-op Plan:   Post-operative Plan: Extubation in OR  Informed Consent:   Dental advisory given  Plan Discussed with: CRNA, Anesthesiologist and Surgeon  Anesthesia Plan Comments: (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

## 2013-01-05 NOTE — Transfer of Care (Signed)
Immediate Anesthesia Transfer of Care Note  Patient: Brandy Shepherd  Procedure(s) Performed: * No procedures listed *  Patient Location: PACU  Anesthesia Type:General  Level of Consciousness: awake, alert  and oriented  Airway & Oxygen Therapy: Patient Spontanous Breathing and Patient connected to face mask oxygen  Post-op Assessment: Report given to PACU RN  Post vital signs: Reviewed and stable  Complications: No apparent anesthesia complications

## 2013-01-05 NOTE — Anesthesia Postprocedure Evaluation (Signed)
  Anesthesia Post-op Note  Patient: Brandy Shepherd  Procedure(s) Performed: * No procedures listed *  Patient Location: PACU  Anesthesia Type:General  Level of Consciousness: awake  Airway and Oxygen Therapy: Patient Spontanous Breathing  Post-op Pain: mild  Post-op Assessment: Post-op Vital signs reviewed  Post-op Vital Signs: Reviewed  Complications: No apparent anesthesia complications

## 2013-01-05 NOTE — H&P (Signed)
Brandy Shepherd is an 47 y.o. female.   Chief Complaint: Pt has suffered nausea/vomiting and abd pain x 2 months. Work up with CTA revealed coincidental splenic artery aneurysm. This finding was compared to previous CT (56yrs ago) and aneurysm was noted. Splenic artery aneurysm has enlarged in that time frame. Pt was consulted with Brandy Shepherd and has opted for embolization. Scheduled now for same.  HPI: HTN; GERD; Rheumatoid arthritis  Past Medical History  Diagnosis Date  . Hypertension   . GERD (gastroesophageal reflux disease)   . Headache(784.0) 12/20/2012    migraines when younger; none in years  . Arthritis   . PONV (postoperative nausea and vomiting)     Past Surgical History  Procedure Laterality Date  . Tubal ligation  2003  . Back surgery  1997, 2003    No family history on file. Social History:  has no tobacco, alcohol, and drug history on file.  Allergies:  Allergies  Allergen Reactions  . Peach [Prunus Persica] Itching and Rash     (Not in a hospital admission)  Results for orders placed during the hospital encounter of 01/05/13 (from the past 48 hour(s))  APTT     Status: None   Collection Time    01/05/13  6:51 AM      Result Value Range   aPTT 30  24 - 37 seconds  BASIC METABOLIC PANEL     Status: Abnormal   Collection Time    01/05/13  6:51 AM      Result Value Range   Sodium 136  135 - 145 mEq/L   Potassium 3.6  3.5 - 5.1 mEq/L   Chloride 103  96 - 112 mEq/L   CO2 24  19 - 32 mEq/L   Glucose, Bld 92  70 - 99 mg/dL   BUN 12  6 - 23 mg/dL   Creatinine, Ser 1.61  0.50 - 1.10 mg/dL   Calcium 9.3  8.4 - 09.6 mg/dL   GFR calc non Af Amer 69 (*) >90 mL/min   GFR calc Af Amer 80 (*) >90 mL/min   Comment: (NOTE)     The eGFR has been calculated using the CKD EPI equation.     This calculation has not been validated in all clinical situations.     eGFR's persistently <90 mL/min signify possible Chronic Kidney     Disease.  CBC WITH  DIFFERENTIAL     Status: Abnormal   Collection Time    01/05/13  6:51 AM      Result Value Range   WBC 8.0  4.0 - 10.5 K/uL   RBC 3.91  3.87 - 5.11 MIL/uL   Hemoglobin 12.8  12.0 - 15.0 g/dL   HCT 04.5  40.9 - 81.1 %   MCV 95.4  78.0 - 100.0 fL   MCH 32.7  26.0 - 34.0 pg   MCHC 34.3  30.0 - 36.0 g/dL   RDW 91.4  78.2 - 95.6 %   Platelets 248  150 - 400 K/uL   Neutrophils Relative % 37 (*) 43 - 77 %   Neutro Abs 2.9  1.7 - 7.7 K/uL   Lymphocytes Relative 50 (*) 12 - 46 %   Lymphs Abs 4.0  0.7 - 4.0 K/uL   Monocytes Relative 9  3 - 12 %   Monocytes Absolute 0.7  0.1 - 1.0 K/uL   Eosinophils Relative 4  0 - 5 %   Eosinophils Absolute 0.3  0.0 - 0.7  K/uL   Basophils Relative 1  0 - 1 %   Basophils Absolute 0.1  0.0 - 0.1 K/uL  PROTIME-INR     Status: None   Collection Time    01/05/13  6:51 AM      Result Value Range   Prothrombin Time 12.8  11.6 - 15.2 seconds   INR 0.98  0.00 - 1.49   No results found.  Review of Systems  Constitutional: Negative for fever and chills.  Respiratory: Negative for sputum production.   Cardiovascular: Negative for chest pain.  Gastrointestinal: Positive for nausea, vomiting and abdominal pain.  Neurological: Positive for weakness. Negative for headaches.  Psychiatric/Behavioral: Negative for substance abuse.    Blood pressure 166/63, pulse 90, temperature 98 F (36.7 C), temperature source Oral, resp. rate 18, height 5' 7.5" (1.715 m), weight 260 lb (117.935 kg), last menstrual period 01/02/2013, SpO2 97.00%. Physical Exam  Constitutional: She is oriented to person, place, and time. She appears well-developed and well-nourished.  Cardiovascular: Normal rate, regular rhythm and normal heart sounds.   No murmur heard. Respiratory: Effort normal and breath sounds normal. She has no wheezes.  GI: Soft. Bowel sounds are normal. There is tenderness.  Musculoskeletal: Normal range of motion.  Neurological: She is alert and oriented to person,  place, and time. Coordination normal.  Skin: Skin is warm and dry.  Psychiatric: She has a normal mood and affect. Her behavior is normal. Judgment and thought content normal.     Assessment/Plan Splenic artery aneurysm- enlarging per CT Now scheduled for aneurysm embolization Pt and family aware of procedure benefits and risks and agreeable to proceed Consent signed and in chart Pt is aware she may be admitted after procedure of necessary.  Brandy Shepherd A 01/05/2013, 8:04 AM

## 2013-01-05 NOTE — ED Notes (Signed)
TC to anesthesia to see if they are available to assume care of the pt as moderate sedation is not sufficient. Awaiting return TC

## 2013-01-06 LAB — CBC
Hemoglobin: 11.6 g/dL — ABNORMAL LOW (ref 12.0–15.0)
MCHC: 33.5 g/dL (ref 30.0–36.0)
Platelets: 268 10*3/uL (ref 150–400)
RBC: 3.6 MIL/uL — ABNORMAL LOW (ref 3.87–5.11)
RDW: 13 % (ref 11.5–15.5)
WBC: 22 10*3/uL — ABNORMAL HIGH (ref 4.0–10.5)

## 2013-01-06 LAB — BASIC METABOLIC PANEL
CO2: 24 mEq/L (ref 19–32)
Chloride: 102 mEq/L (ref 96–112)
Creatinine, Ser: 1.02 mg/dL (ref 0.50–1.10)
GFR calc Af Amer: 76 mL/min — ABNORMAL LOW (ref >90)
GFR calc non Af Amer: 65 mL/min — ABNORMAL LOW (ref >90)
Potassium: 4.1 mEq/L (ref 3.5–5.1)
Sodium: 135 mEq/L (ref 135–145)

## 2013-01-06 LAB — POCT I-STAT 4, (NA,K, GLUC, HGB,HCT)
Glucose, Bld: 98 mg/dL (ref 70–99)
Hemoglobin: 11.9 g/dL — ABNORMAL LOW (ref 12.0–15.0)
Potassium: 3.8 mEq/L (ref 3.5–5.1)
Sodium: 137 mEq/L (ref 135–145)

## 2013-01-06 MED ORDER — KETOROLAC TROMETHAMINE 10 MG PO TABS
10.0000 mg | ORAL_TABLET | Freq: Four times a day (QID) | ORAL | Status: DC
  Administered 2013-01-06: 10 mg via ORAL
  Filled 2013-01-06 (×4): qty 1

## 2013-01-06 NOTE — Progress Notes (Signed)
Patient discharged to home. Patient AVS reviewed. Patient verbalized understanding of incision care, medications and follow-up appointments.  Patient remains stable; no signs or symptoms of distress.  Educated to return to the ER in cases of SOB, dizziness, fever, chest pain, or fainting.

## 2013-01-06 NOTE — Progress Notes (Signed)
Remainder of Patient's Dilaudid PCA, a total of 22.5 mL, was wasted. Waste was witnessed by myself and [employer], Charity fundraiser.

## 2013-01-06 NOTE — Discharge Summary (Signed)
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 perfusion of the spleen given her immunocompromised status (RA medications). As a last resort, a sandwich coiling of the distal splenic artery can be performed although this will result in some loss of splenic tissue. If this happens, she will require approximately 2 weeks of antibiotic therapy as well as vaccination against encapsulated organisms to decrease the  risk of splenic abscess formation and overwhelming sepsis. We will try to avoid devascularization of the spleen if at all possible. 2. Hypertension - recently diagnosed and undergoing management with hydrochlorothiazide per her primary care physician -continue aggressive blood pressure management, particularly until her aneurysm Brandy successfully excluded 3. Chronic right lower quadrant abdominal pain, nausea, vomiting and weight loss - I do not think the symptoms are related the patient's splenic artery aneurysm. I think this Brandy an incidental but important finding. - I encouraged her and feel that her appointment with gastroenterology later this month will prove fruitful.  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   Electronically Signed   By: Malachy Moan M.D.   On: 12/21/2012 13:03    Discharge Exam: Blood pressure 123/66, pulse 88, temperature 97.7 F (36.5 C), temperature source Oral, resp. rate 18, height 5' 7.5" (1.715 m), weight 270 lb 9.6 oz (122.743 kg), last menstrual period 12/27/2012, SpO2 100.00%. The patient Brandy awake, alert and oriented. Chest Brandy clear to auscultation bilaterally. Heart with regular rate and rhythm. Abdomen obese, soft, mild/moderate tenderness in the left upper quadrant. Minimal tenderness in the right lower quadrant, positive bowel sounds. Puncture site right common femoral artery soft, intact clean bandage, no definite hematoma, mildly tender to palpation. Extremities with full range of motion, no edema, intact distal pulses.  Disposition:   Discharge Orders   Future Orders Complete By Expires   Call MD for:  difficulty breathing, headache or visual disturbances  As directed    Call MD for:  persistant dizziness or light-headedness  As directed    Call MD for:   persistant nausea and vomiting  As directed    Call MD for:  redness, tenderness, or signs of infection (pain, swelling, redness, odor or green/yellow discharge around incision site)  As directed    Call MD for:  severe uncontrolled pain  As directed    Call MD for:  temperature >100.4  As directed    Change dressing (specify)  As directed    Comments:  May wash right groin puncture site with soap and water; may apply bandaid to site daily for next 3- 4 days   Diet - low sodium heart healthy  As directed    Discharge instructions  As directed    Comments:     Radiology service will call you with follow up appt with Dr. Archer Asa in IR clinic in 2 weeks; call [phone] or [phone] with any questions   Discharge patient  As directed    Driving Restrictions  As directed    Comments:     No driving for next 48 hours   Increase activity slowly  As directed    Lifting restrictions  As directed    Comments:     No heavy lifting for next 3-4 days   May shower / Bathe  As directed        Medication List         FLUoxetine 40 MG capsule  Commonly known as:  PROZAC  Take 40 mg by mouth daily.     folic acid 1 MG tablet  Commonly known as:  FOLVITE  Take 1 mg by mouth daily.     HUMIRA 40 MG/0.8ML injection  Generic drug:  adalimumab  Inject 40 mg into the skin every 14 (fourteen) days.     lisinopril-hydrochlorothiazide 10-12.5 MG per tablet  Commonly known as:  PRINZIDE,ZESTORETIC  Take 1 tablet by mouth daily.     methotrexate 2.5 MG tablet  Commonly known as:  RHEUMATREX  Take 2.5 mg by mouth once a week. Caution:Chemotherapy. Protect from light.     metoCLOPramide 10 MG tablet  Commonly known as:  REGLAN  Take 10 mg by mouth every 8 (eight) hours as needed for nausea.     NUCYNTA ER 250 MG Tb12  Generic drug:  Tapentadol HCl  Take 250 mg by mouth 2 (two) times daily.     omeprazole 20 MG capsule  Commonly known as:  PRILOSEC  Take 20 mg by mouth daily.      oxyCODONE-acetaminophen 10-325 MG per tablet  Commonly known as:  PERCOCET  Take 1 tablet by mouth every 4 (four) hours as needed for pain.     promethazine 25 MG tablet  Commonly known as:  PHENERGAN  Take 25 mg by mouth every 6 (six) hours as needed for nausea or vomiting.     traZODone 50 MG tablet  Commonly known as:  DESYREL  Take 50 mg by mouth at bedtime.           Follow-up Information   Follow up with [facility], Kandis Cocking, MD. (radiology will call you with follow up appt with Dr. Archer Asa in 2 weeks; call [phone] or [phone] with any questions)    Specialty:  Interventional Radiology   Contact information:   [address] [facility] [place] [address] [phone]       Signed: Chinita Pester 01/06/2013, 10:34 AM

## 2013-01-09 ENCOUNTER — Other Ambulatory Visit: Payer: Self-pay | Admitting: Radiology

## 2013-01-09 DIAGNOSIS — I728 Aneurysm of other specified arteries: Secondary | ICD-10-CM

## 2013-01-09 LAB — TYPE AND SCREEN: Unit division: 0

## 2013-01-10 ENCOUNTER — Telehealth: Payer: Self-pay | Admitting: Emergency Medicine

## 2013-01-10 NOTE — Addendum Note (Signed)
Addendum created 01/10/13 1408 by Judie Petit, MD   Modules edited: Anesthesia Responsible Staff

## 2013-01-10 NOTE — Telephone Encounter (Addendum)
Called pt to ck status post embo.  She is still experiencing left sided pain- just in that general area, does not radiate. Still taking pain meds, antib's, and IBU. She will see how she does over the weekend and call me on Monday. She may need a note to stay out of work longer.   We will set up f/u appt on Monday.

## 2013-01-15 ENCOUNTER — Telehealth: Payer: Self-pay | Admitting: Emergency Medicine

## 2013-01-15 ENCOUNTER — Telehealth: Payer: Self-pay | Admitting: Interventional Radiology

## 2013-01-15 ENCOUNTER — Encounter: Payer: Self-pay | Admitting: Emergency Medicine

## 2013-01-15 DIAGNOSIS — I728 Aneurysm of other specified arteries: Secondary | ICD-10-CM

## 2013-01-15 NOTE — Telephone Encounter (Signed)
I called Brandy Shepherd today.  She is now POD#10 s/p coil embolization of her splenic artery and she continues to have LUQ pain which occasionally radiates to her left shoulder.  She also has some occasional pleuritic pain on that side.  Last night she felt cold and needed several blankets, but she has not had any confirmed fevers.  Denies nausea, vomiting, or flu-like symptoms.  She did complete her Levaquin.   I reassured her and suggested she try OTC ibuprofen 800 mg PO Q6-8 hrs for the next 48 hours.  I or my staff will call to check on her Wednesday to see how she is progressing.  I will see her in clinic on my next available day.  If she has any issues before Wednesday, she knows to call us right away.  Signed,  Brandy Big, MD [facility]

## 2013-01-15 NOTE — Telephone Encounter (Signed)
Pt called w/ status update - still having constant pain; sometimes it does radiate up to her shoulder and lt arm.  She has increased her pain meds. She needs a note to stay out of work this week planing on going back 01-22-13.  Sent Dr Archer Asa a message to contact pt at home today.- will wait to hear back from him.

## 2013-01-17 ENCOUNTER — Telehealth: Payer: Self-pay | Admitting: Emergency Medicine

## 2013-01-17 NOTE — Telephone Encounter (Signed)
LM TO [facility] STATUS OF PT.  TOLD HER TO CALL IF SYMPTOMS ARE NOT BETTER AND IF SHE NEEDS TO BE SEEN TODAY,  IF NOT, WE WILL SEE HER AT HER SCHEDULED APPT ON 01-30-13.  Jari Sportsman, EMT 01/17/2013 12:18 PM

## 2013-01-30 ENCOUNTER — Other Ambulatory Visit: Payer: 59

## 2013-01-31 ENCOUNTER — Other Ambulatory Visit: Payer: Self-pay | Admitting: Emergency Medicine

## 2013-01-31 ENCOUNTER — Other Ambulatory Visit (HOSPITAL_COMMUNITY): Payer: Self-pay | Admitting: Interventional Radiology

## 2013-01-31 DIAGNOSIS — I728 Aneurysm of other specified arteries: Secondary | ICD-10-CM

## 2013-02-02 ENCOUNTER — Ambulatory Visit (HOSPITAL_COMMUNITY)
Admission: RE | Admit: 2013-02-02 | Discharge: 2013-02-02 | Disposition: A | Discharge status: Home / Self Care | Payer: 59 | Source: Ambulatory Visit | Attending: Interventional Radiology | Admitting: Interventional Radiology

## 2013-02-02 ENCOUNTER — Encounter (HOSPITAL_COMMUNITY): Payer: Self-pay

## 2013-02-02 DIAGNOSIS — R1012 Left upper quadrant pain: Secondary | ICD-10-CM | POA: Insufficient documentation

## 2013-02-02 DIAGNOSIS — D7389 Other diseases of spleen: Secondary | ICD-10-CM | POA: Insufficient documentation

## 2013-02-02 DIAGNOSIS — I728 Aneurysm of other specified arteries: Secondary | ICD-10-CM

## 2013-02-02 DIAGNOSIS — Z981 Arthrodesis status: Secondary | ICD-10-CM | POA: Insufficient documentation

## 2013-02-02 MED ORDER — IOHEXOL 350 MG/ML SOLN
100.0000 mL | Freq: Once | INTRAVENOUS | Status: AC | PRN
  Administered 2013-02-02: 100 mL via INTRAVENOUS

## 2013-02-07 ENCOUNTER — Ambulatory Visit
Admission: RE | Admit: 2013-02-07 | Discharge: 2013-02-07 | Disposition: A | Discharge status: Home / Self Care | Payer: 59 | Source: Ambulatory Visit | Attending: Radiology | Admitting: Radiology

## 2013-02-07 ENCOUNTER — Other Ambulatory Visit (HOSPITAL_COMMUNITY): Payer: Self-pay | Admitting: Interventional Radiology

## 2013-02-07 DIAGNOSIS — I728 Aneurysm of other specified arteries: Secondary | ICD-10-CM

## 2013-02-07 DIAGNOSIS — L589 Radiodermatitis, unspecified: Secondary | ICD-10-CM

## 2013-02-07 NOTE — Progress Notes (Signed)
Pt. Still having left sided pain " cramp sensation like when you run".  IBU did help, but still having pain.  Still nauseated Denies fever, chills, sweats, and bowel issues. Past 2wks a new red, itchy, dry , rash has developed on her left tricep area and on the left side of her back.   Send did go see her primary care dr. About the rash and they gave her an RX for Prednisone.  They told her not to take it until she was seen today.

## 2013-02-12 ENCOUNTER — Telehealth: Payer: Self-pay | Admitting: Emergency Medicine
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
## 2013-02-20 ENCOUNTER — Other Ambulatory Visit (HOSPITAL_COMMUNITY): Payer: Self-pay | Admitting: Interventional Radiology

## 2013-02-20 ENCOUNTER — Ambulatory Visit
Admission: RE | Admit: 2013-02-20 | Discharge: 2013-02-20 | Disposition: A | Discharge status: Home / Self Care | Payer: 59 | Source: Ambulatory Visit | Attending: Interventional Radiology | Admitting: Interventional Radiology

## 2013-02-20 DIAGNOSIS — L589 Radiodermatitis, unspecified: Secondary | ICD-10-CM

## 2013-02-20 DIAGNOSIS — I728 Aneurysm of other specified arteries: Secondary | ICD-10-CM
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
## 2013-03-07 ENCOUNTER — Ambulatory Visit
Admission: RE | Admit: 2013-03-07 | Discharge: 2013-03-07 | Disposition: A | Discharge status: Home / Self Care | Payer: 59 | Source: Ambulatory Visit | Attending: Interventional Radiology | Admitting: Interventional Radiology

## 2013-03-07 DIAGNOSIS — L589 Radiodermatitis, unspecified: Secondary | ICD-10-CM

## 2013-03-07 DIAGNOSIS — I728 Aneurysm of other specified arteries: Secondary | ICD-10-CM
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
## 2013-05-24 ENCOUNTER — Other Ambulatory Visit: Payer: Self-pay

## 2013-05-24 DIAGNOSIS — Z1231 Encounter for screening mammogram for malignant neoplasm of breast: Secondary | ICD-10-CM

## 2013-06-05 ENCOUNTER — Telehealth: Payer: Self-pay | Admitting: Emergency Medicine
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
## 2013-06-08 ENCOUNTER — Ambulatory Visit: Payer: 59

## 2014-01-23 ENCOUNTER — Other Ambulatory Visit (HOSPITAL_COMMUNITY): Payer: Self-pay | Admitting: Interventional Radiology

## 2014-01-23 DIAGNOSIS — I728 Aneurysm of other specified arteries: Secondary | ICD-10-CM

## 2014-02-20 ENCOUNTER — Encounter: Payer: Self-pay | Admitting: Radiology

## 2015-07-03 IMAGING — US IR ANGIO/VISCERAL SELECTIVE EA VESSEL WO/W FLUSH
1 series · 1 of 1 positions shown · non-contrast
Comparison: none

ADDENDUM:
The calculated dose area product is [id number] mGy. Of note, this is
throughout the course of the procedure which included multiple
fields of view. Approximately 50 percent of the total fluoroscopy
time was performed in a single field of view.
CLINICAL DATA: 47-year-old female with an incidentally discovered
enlarging distal splenic artery aneurysm. Aneurysm currently
measures up to 2.4 cm on CT angiogram and poses a possible risk for
rupture. Patient presents today for elective stent exclusion versus
coil embolization of the aneurysm.
TECHNIQUE: Informed consent was obtained from the patient following explanation
of the procedure, risks, benefits and alternatives. The patient
understands, agrees and consents for the procedure. All questions
were addressed. A time out was performed.

[Series 1: ir angio/visceral selective ea vessel wo/w flush · 1 of 1 slices shown]
[im 1/1]
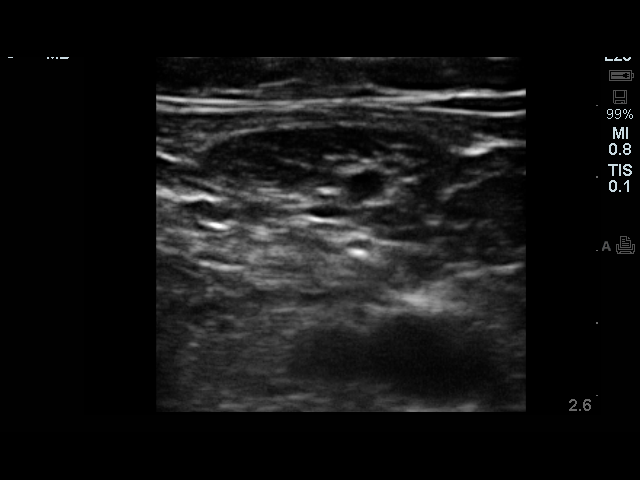

[1 of 1 positions shown; findings below may reference images not displayed]

EXAM:
WORKSTATION 3D RECONSTRUCTION; SELECTIVE VISCERAL ARTERIOGRAPHY; IR
EMBO ARTERIAL NOT HEMORR SERANI INC GUIDE ROADMAPPING; IR ULTRASOUND
GUIDANCE VASC ACCESS RIGHT; ARTERIOGRAPHY; ADDITIONAL ARTERIOGRAPHY

Date: 01/05/2013

PROCEDURE:
1. Ultrasound-guided puncture of the right superficial femoral
artery
2. Catheterization of the celiac artery with arteriogram
3. Catheterization of the splenic artery with arteriogram
4. Super selective catheterization of the distal splenic artery near
the hilum.
5. Successful coil embolization of the distal splenic artery an
aneurysm sac.
6. Limited right common femoral arteriogram
7. Arterial access closure with Mynx Ace device

ANESTHESIA/SEDATION:
Moderate (conscious) sedation was used initially. Ten mg Versed, 300
mcg Fentanyl and 4 mg of Dilaudid were administered intravenously.
The patient's vital signs were monitored continuously by radiology
nursing throughout the procedure. However, conscious sedation cannot
be adequately achieved. Therefore, anesthesia was consulted and the
patient was 1st anesthetize with propofol, and then intubated and
placed under general anesthesia for the remainder of the procedure.

Sedation Time: 60 minutes

OTHER MEDICATIONS:
2 g of Ancef were administered intravenously prior to arterial
closure device. 4777 units of heparin were also administered.

FLUOROSCOPY TIME:  70 min 24 seconds

CONTRAST:  180mL OMNIPAQUE IOHEXOL 300 MG/ML  SOLN
Maximal barrier sterile technique utilized including caps, mask,
sterile gowns, sterile gloves, large sterile drape, hand hygiene,
and Betadine skin prep.

The right groin was interrogated with ultrasound. There is a high
bifurcation of the right common femoral artery. The bifurcation may
lie above the inguinal ligament. Therefore, the decision was made to
proceed with a direct puncture of the superficial femoral artery in
a retrograde fashion. Local anesthesia was attained by infiltration
with 1% lidocaine. A small dermatotomy was made. Under real-time
sonographic guidance, the superficial femoral artery was punctured
with a 21 gauge micropuncture needle. An image was obtained and
stored for the medical record.

Using a transitional micro sheath, the 0.018 inch access wire was
exchanged for a 0.035 inch Bentson wire. The Bentson wire was
advanced in the abdominal aorta and a 5 French sheath was placed.
Celiac artery was then catheterized with a soft Omni Flush catheter.
I hand injection of contrast material was performed. There is a
replaced right hepatic artery to the superior mesenteric artery. The
larger distal splenic artery aneurysm was not visible. Additional 3D
angiography was performed to further facilitate the aneurysm
anatomy.

Over a Glidewire, a a 6 French Slip catheter was advanced in the
more distal splenic artery. Splenic arteriography was then performed
in multiple obliquities. There is a saccular aneurysm arising from
the distal splenic artery which measures a maximum of 2.4 cm in
height and approximately 1.6 cm in width. The aneurysm neck is as
wide as the aneurysm itself at approximately 1.6 cm. Additionally,
there is a tiny 2- 3 mm intraparenchymal aneurysm which is
incidentally noted.

The 5 French sheath was exchanged for a 90 cm neuro max hydrophilic
braided 6 French sheath. Using coaxial technique, the sheath was
advanced into the distal splenic artery. The aneurysm sac was
successfully crossed using a Glidewire and the slip catheter. The
micro wire was then advanced into the splenic artery beyond the
aneurysm. Due to extreme tortuosity in the vessel, it would be
impossible to advanced A covered Viabahn stent. Therefore, an
attempt was made to advance a Cordis precise self expanding stent to
facilitate stent assisted embolization. However, the stent could not
advanced through the tortuous vessel. Therefore, the decision was
made to coil embolize the distal splenic artery aneurysm. This was
performed using detachable micro coils. The distal splenic artery
beyond the aneurysm sac was 1st coil embolized with a 7 x 300, 8 x
300 and 3x 9 x 300 mm Concerto micro coils. At this point, the
catheter had a prolapsed back into the aneurysm sac. The aneurysm
sac was then embolized with a total of 2 x 18 x 400 mm and 4x 20 x
500 mm Concerto coils. Once sufficient coil packing of the dome and
been achieved, the micro catheter was brought back into the splenic
artery just proximal to the aneurysm neck. This was ultimately coil
embolized with a 14 x 400, 12 x 300 and 8 x 300 mm Concerto
microcoils.

The catheter was pulled back into the proximal splenic artery and an
arteriogram performed. There is no flow into the excluded aneurysm
sac. In the late phase, there is mild parenchymal pleural a sheath
in the superior and inferior splenic poles likely secondary to short
gastric and inferior epigastric collaterals. The catheter was pulled
back into the common iliac artery and a limited right common femoral
arteriogram was performed. This confirms infra inguinal access of
the robust superficial femoral artery overlying the femoral head.
Hemostasis was attained with the assistance of a Mynx Ace closure
device.

COMPLICATION:
Inability to obtain adequate sedation despite maximal dosing. This
necessitated Intraprocedural anesthesia consultation for general
anesthesia to continue the procedure.
IMPRESSION: Successful coil embolization of enlarging distal splenic artery
aneurysm.

## 2016-01-27 DIAGNOSIS — M0579 Rheumatoid arthritis with rheumatoid factor of multiple sites without organ or systems involvement: Secondary | ICD-10-CM | POA: Diagnosis not present

## 2016-01-27 DIAGNOSIS — G894 Chronic pain syndrome: Secondary | ICD-10-CM | POA: Diagnosis not present

## 2016-02-16 DIAGNOSIS — R5383 Other fatigue: Secondary | ICD-10-CM | POA: Diagnosis not present

## 2016-02-16 DIAGNOSIS — R05 Cough: Secondary | ICD-10-CM | POA: Diagnosis not present

## 2016-02-16 DIAGNOSIS — J019 Acute sinusitis, unspecified: Secondary | ICD-10-CM | POA: Diagnosis not present

## 2016-02-23 DIAGNOSIS — Z79899 Other long term (current) drug therapy: Secondary | ICD-10-CM | POA: Diagnosis not present

## 2016-02-23 DIAGNOSIS — L819 Disorder of pigmentation, unspecified: Secondary | ICD-10-CM | POA: Diagnosis not present

## 2016-02-23 DIAGNOSIS — M0609 Rheumatoid arthritis without rheumatoid factor, multiple sites: Secondary | ICD-10-CM | POA: Diagnosis not present

## 2016-03-04 DIAGNOSIS — L814 Other melanin hyperpigmentation: Secondary | ICD-10-CM | POA: Diagnosis not present

## 2016-03-04 DIAGNOSIS — B351 Tinea unguium: Secondary | ICD-10-CM | POA: Diagnosis not present

## 2016-03-10 DIAGNOSIS — Z79899 Other long term (current) drug therapy: Secondary | ICD-10-CM | POA: Diagnosis not present

## 2016-04-15 DIAGNOSIS — L601 Onycholysis: Secondary | ICD-10-CM | POA: Diagnosis not present

## 2016-05-07 DIAGNOSIS — Z5181 Encounter for therapeutic drug level monitoring: Secondary | ICD-10-CM | POA: Diagnosis not present

## 2016-05-07 DIAGNOSIS — M542 Cervicalgia: Secondary | ICD-10-CM | POA: Diagnosis not present

## 2016-05-07 DIAGNOSIS — M0579 Rheumatoid arthritis with rheumatoid factor of multiple sites without organ or systems involvement: Secondary | ICD-10-CM | POA: Diagnosis not present

## 2016-05-07 DIAGNOSIS — G894 Chronic pain syndrome: Secondary | ICD-10-CM | POA: Diagnosis not present

## 2016-05-24 DIAGNOSIS — Z79899 Other long term (current) drug therapy: Secondary | ICD-10-CM | POA: Diagnosis not present

## 2016-05-24 DIAGNOSIS — M0609 Rheumatoid arthritis without rheumatoid factor, multiple sites: Secondary | ICD-10-CM | POA: Diagnosis not present

## 2016-06-29 DIAGNOSIS — B351 Tinea unguium: Secondary | ICD-10-CM | POA: Diagnosis not present

## 2016-07-26 DIAGNOSIS — R799 Abnormal finding of blood chemistry, unspecified: Secondary | ICD-10-CM | POA: Diagnosis not present

## 2016-07-26 DIAGNOSIS — R350 Frequency of micturition: Secondary | ICD-10-CM | POA: Diagnosis not present

## 2016-07-26 DIAGNOSIS — R945 Abnormal results of liver function studies: Secondary | ICD-10-CM | POA: Diagnosis not present

## 2016-07-26 DIAGNOSIS — Z79899 Other long term (current) drug therapy: Secondary | ICD-10-CM | POA: Diagnosis not present

## 2016-07-26 DIAGNOSIS — I10 Essential (primary) hypertension: Secondary | ICD-10-CM | POA: Diagnosis not present

## 2016-08-06 DIAGNOSIS — M1712 Unilateral primary osteoarthritis, left knee: Secondary | ICD-10-CM | POA: Diagnosis not present

## 2016-08-06 DIAGNOSIS — G894 Chronic pain syndrome: Secondary | ICD-10-CM | POA: Diagnosis not present

## 2016-08-06 DIAGNOSIS — M0579 Rheumatoid arthritis with rheumatoid factor of multiple sites without organ or systems involvement: Secondary | ICD-10-CM | POA: Diagnosis not present

## 2016-08-23 DIAGNOSIS — Z79899 Other long term (current) drug therapy: Secondary | ICD-10-CM | POA: Diagnosis not present

## 2016-08-23 DIAGNOSIS — M0609 Rheumatoid arthritis without rheumatoid factor, multiple sites: Secondary | ICD-10-CM | POA: Diagnosis not present

## 2016-09-14 DIAGNOSIS — D519 Vitamin B12 deficiency anemia, unspecified: Secondary | ICD-10-CM | POA: Diagnosis not present

## 2016-09-14 DIAGNOSIS — Z79899 Other long term (current) drug therapy: Secondary | ICD-10-CM | POA: Diagnosis not present

## 2016-09-14 DIAGNOSIS — R42 Dizziness and giddiness: Secondary | ICD-10-CM | POA: Diagnosis not present

## 2016-10-26 DIAGNOSIS — G894 Chronic pain syndrome: Secondary | ICD-10-CM | POA: Diagnosis not present

## 2016-10-26 DIAGNOSIS — M545 Low back pain: Secondary | ICD-10-CM | POA: Diagnosis not present

## 2016-10-26 DIAGNOSIS — M0609 Rheumatoid arthritis without rheumatoid factor, multiple sites: Secondary | ICD-10-CM | POA: Diagnosis not present

## 2017-01-20 DIAGNOSIS — Z0000 Encounter for general adult medical examination without abnormal findings: Secondary | ICD-10-CM | POA: Diagnosis not present

## 2017-01-20 DIAGNOSIS — R5383 Other fatigue: Secondary | ICD-10-CM | POA: Diagnosis not present

## 2017-01-20 DIAGNOSIS — Z0001 Encounter for general adult medical examination with abnormal findings: Secondary | ICD-10-CM | POA: Diagnosis not present

## 2017-01-20 DIAGNOSIS — D509 Iron deficiency anemia, unspecified: Secondary | ICD-10-CM | POA: Diagnosis not present

## 2017-01-20 DIAGNOSIS — Z01419 Encounter for gynecological examination (general) (routine) without abnormal findings: Secondary | ICD-10-CM | POA: Diagnosis not present

## 2017-01-27 DIAGNOSIS — G894 Chronic pain syndrome: Secondary | ICD-10-CM | POA: Diagnosis not present

## 2017-01-27 DIAGNOSIS — M545 Low back pain: Secondary | ICD-10-CM | POA: Diagnosis not present

## 2017-01-27 DIAGNOSIS — M0609 Rheumatoid arthritis without rheumatoid factor, multiple sites: Secondary | ICD-10-CM | POA: Diagnosis not present

## 2017-02-03 DIAGNOSIS — B351 Tinea unguium: Secondary | ICD-10-CM | POA: Diagnosis not present

## 2017-02-28 DIAGNOSIS — M0609 Rheumatoid arthritis without rheumatoid factor, multiple sites: Secondary | ICD-10-CM | POA: Diagnosis not present

## 2017-02-28 DIAGNOSIS — Z79899 Other long term (current) drug therapy: Secondary | ICD-10-CM | POA: Diagnosis not present

## 2017-03-09 DIAGNOSIS — R011 Cardiac murmur, unspecified: Secondary | ICD-10-CM | POA: Diagnosis not present

## 2017-03-09 DIAGNOSIS — M069 Rheumatoid arthritis, unspecified: Secondary | ICD-10-CM | POA: Diagnosis not present

## 2017-03-09 DIAGNOSIS — I10 Essential (primary) hypertension: Secondary | ICD-10-CM | POA: Diagnosis not present

## 2017-03-17 DIAGNOSIS — B351 Tinea unguium: Secondary | ICD-10-CM | POA: Diagnosis not present

## 2017-03-25 DIAGNOSIS — N959 Unspecified menopausal and perimenopausal disorder: Secondary | ICD-10-CM | POA: Diagnosis not present

## 2017-03-25 DIAGNOSIS — Z1231 Encounter for screening mammogram for malignant neoplasm of breast: Secondary | ICD-10-CM | POA: Diagnosis not present

## 2017-03-25 DIAGNOSIS — M8589 Other specified disorders of bone density and structure, multiple sites: Secondary | ICD-10-CM | POA: Diagnosis not present

## 2017-03-29 ENCOUNTER — Telehealth: Payer: Self-pay

## 2017-03-29 NOTE — Telephone Encounter (Signed)
SENT REFERRAL TO SCHEDULING FROM DR Jen Mow DO [facility] # [phone]

## 2017-04-01 ENCOUNTER — Encounter: Payer: Self-pay | Admitting: Cardiology

## 2017-04-01 ENCOUNTER — Ambulatory Visit: Payer: 59 | Admitting: Cardiology

## 2017-04-01 VITALS — BP 122/84 | HR 75 | Ht 67.5 in | Wt 224.2 lb

## 2017-04-01 DIAGNOSIS — R079 Chest pain, unspecified: Secondary | ICD-10-CM | POA: Diagnosis not present

## 2017-04-01 DIAGNOSIS — R0602 Shortness of breath: Secondary | ICD-10-CM | POA: Diagnosis not present

## 2017-04-01 DIAGNOSIS — R011 Cardiac murmur, unspecified: Secondary | ICD-10-CM

## 2017-04-01 DIAGNOSIS — M069 Rheumatoid arthritis, unspecified: Secondary | ICD-10-CM | POA: Diagnosis not present
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
## 2017-04-01 NOTE — Patient Instructions (Addendum)
Medication Instructions:  Your provider recommends that you continue on your current medications as directed. Please refer to the Current Medication list given to you today.   Labwork: None ordered.  Testing/Procedures: Your provider has requested that you have an echocardiogram. Echocardiography is a painless test that uses sound waves to create images of your heart. It provides your doctor with information about the size and shape of your heart and how well your heart's chambers and valves are working. This procedure takes approximately one hour. There are no restrictions for this procedure.    Your provider has requested that you have an exercise tolerance test. For further information please visit [email or web address] Please also follow instruction sheet, as given.    Follow-Up: Your provider recommends that you schedule a follow-up appointment AS NEEDED pending study results.  Any Other Special Instructions Will Be Listed Below (If Applicable).     If you need a refill on your cardiac medications before your next appointment, please call your pharmacy.

## 2017-04-05 ENCOUNTER — Ambulatory Visit (HOSPITAL_COMMUNITY): Payer: 59 | Attending: Cardiology

## 2017-04-05 ENCOUNTER — Other Ambulatory Visit: Payer: Self-pay

## 2017-04-05 DIAGNOSIS — R011 Cardiac murmur, unspecified: Secondary | ICD-10-CM | POA: Diagnosis not present

## 2017-04-05 DIAGNOSIS — R079 Chest pain, unspecified: Secondary | ICD-10-CM | POA: Diagnosis not present

## 2017-04-05 DIAGNOSIS — I10 Essential (primary) hypertension: Secondary | ICD-10-CM | POA: Insufficient documentation

## 2017-04-05 DIAGNOSIS — R0602 Shortness of breath: Secondary | ICD-10-CM | POA: Insufficient documentation

## 2017-04-06 ENCOUNTER — Ambulatory Visit (INDEPENDENT_AMBULATORY_CARE_PROVIDER_SITE_OTHER): Payer: 59

## 2017-04-06 DIAGNOSIS — R0602 Shortness of breath: Secondary | ICD-10-CM

## 2017-04-06 DIAGNOSIS — R079 Chest pain, unspecified: Secondary | ICD-10-CM

## 2017-04-06 LAB — EXERCISE TOLERANCE TEST
CHL CUP MPHR: 171 {beats}/min
CHL RATE OF PERCEIVED EXERTION: 17
CSEPED: 6 min
Estimated workload: 7 METS
Exercise duration (sec): 19 s
Peak HR: 148 {beats}/min
Percent HR: 86 %
Rest HR: 65 {beats}/min

## 2017-04-12 DIAGNOSIS — I10 Essential (primary) hypertension: Secondary | ICD-10-CM | POA: Diagnosis not present

## 2017-04-12 DIAGNOSIS — M069 Rheumatoid arthritis, unspecified: Secondary | ICD-10-CM | POA: Diagnosis not present

## 2017-04-14 DIAGNOSIS — I10 Essential (primary) hypertension: Secondary | ICD-10-CM | POA: Diagnosis not present

## 2017-04-14 DIAGNOSIS — M069 Rheumatoid arthritis, unspecified: Secondary | ICD-10-CM | POA: Diagnosis not present

## 2017-04-14 DIAGNOSIS — Z136 Encounter for screening for cardiovascular disorders: Secondary | ICD-10-CM | POA: Diagnosis not present

## 2017-04-14 DIAGNOSIS — D509 Iron deficiency anemia, unspecified: Secondary | ICD-10-CM | POA: Diagnosis not present

## 2017-04-20 ENCOUNTER — Ambulatory Visit: Payer: Self-pay | Admitting: Physician Assistant

## 2017-04-22 DIAGNOSIS — M0609 Rheumatoid arthritis without rheumatoid factor, multiple sites: Secondary | ICD-10-CM | POA: Diagnosis not present

## 2017-04-22 DIAGNOSIS — G894 Chronic pain syndrome: Secondary | ICD-10-CM | POA: Diagnosis not present

## 2017-04-22 DIAGNOSIS — M545 Low back pain: Secondary | ICD-10-CM | POA: Diagnosis not present

## 2017-04-24 DIAGNOSIS — J029 Acute pharyngitis, unspecified: Secondary | ICD-10-CM | POA: Diagnosis not present

## 2017-04-24 DIAGNOSIS — R51 Headache: Secondary | ICD-10-CM | POA: Diagnosis not present

## 2017-04-28 DIAGNOSIS — Z79899 Other long term (current) drug therapy: Secondary | ICD-10-CM | POA: Diagnosis not present

## 2017-04-28 DIAGNOSIS — M0609 Rheumatoid arthritis without rheumatoid factor, multiple sites: Secondary | ICD-10-CM | POA: Diagnosis not present

## 2017-06-28 DIAGNOSIS — J309 Allergic rhinitis, unspecified: Secondary | ICD-10-CM | POA: Diagnosis not present

## 2017-06-28 DIAGNOSIS — I10 Essential (primary) hypertension: Secondary | ICD-10-CM | POA: Diagnosis not present

## 2017-06-28 DIAGNOSIS — R609 Edema, unspecified: Secondary | ICD-10-CM | POA: Diagnosis not present

## 2017-07-27 DIAGNOSIS — M545 Low back pain: Secondary | ICD-10-CM | POA: Diagnosis not present

## 2017-07-27 DIAGNOSIS — G894 Chronic pain syndrome: Secondary | ICD-10-CM | POA: Diagnosis not present

## 2017-07-27 DIAGNOSIS — M0609 Rheumatoid arthritis without rheumatoid factor, multiple sites: Secondary | ICD-10-CM | POA: Diagnosis not present

## 2017-08-01 DIAGNOSIS — Z79899 Other long term (current) drug therapy: Secondary | ICD-10-CM | POA: Diagnosis not present

## 2017-08-01 DIAGNOSIS — M0609 Rheumatoid arthritis without rheumatoid factor, multiple sites: Secondary | ICD-10-CM | POA: Diagnosis not present

## 2017-09-30 DIAGNOSIS — I10 Essential (primary) hypertension: Secondary | ICD-10-CM | POA: Diagnosis not present

## 2017-09-30 DIAGNOSIS — M069 Rheumatoid arthritis, unspecified: Secondary | ICD-10-CM | POA: Diagnosis not present

## 2017-09-30 DIAGNOSIS — M545 Low back pain: Secondary | ICD-10-CM | POA: Diagnosis not present

## 2017-09-30 DIAGNOSIS — Z23 Encounter for immunization: Secondary | ICD-10-CM | POA: Diagnosis not present

## 2017-11-01 DIAGNOSIS — M545 Low back pain: Secondary | ICD-10-CM | POA: Diagnosis not present

## 2017-11-01 DIAGNOSIS — M069 Rheumatoid arthritis, unspecified: Secondary | ICD-10-CM | POA: Diagnosis not present

## 2017-11-07 DIAGNOSIS — Z79899 Other long term (current) drug therapy: Secondary | ICD-10-CM | POA: Diagnosis not present

## 2017-11-07 DIAGNOSIS — M0609 Rheumatoid arthritis without rheumatoid factor, multiple sites: Secondary | ICD-10-CM | POA: Diagnosis not present

## 2017-11-14 DIAGNOSIS — Z6837 Body mass index (BMI) 37.0-37.9, adult: Secondary | ICD-10-CM | POA: Diagnosis not present

## 2017-11-14 DIAGNOSIS — M069 Rheumatoid arthritis, unspecified: Secondary | ICD-10-CM | POA: Diagnosis not present

## 2017-11-14 DIAGNOSIS — M26622 Arthralgia of left temporomandibular joint: Secondary | ICD-10-CM | POA: Diagnosis not present

## 2017-12-06 DIAGNOSIS — M47816 Spondylosis without myelopathy or radiculopathy, lumbar region: Secondary | ICD-10-CM | POA: Diagnosis not present

## 2017-12-06 DIAGNOSIS — M47812 Spondylosis without myelopathy or radiculopathy, cervical region: Secondary | ICD-10-CM | POA: Diagnosis not present

## 2017-12-06 DIAGNOSIS — Z79891 Long term (current) use of opiate analgesic: Secondary | ICD-10-CM | POA: Diagnosis not present

## 2017-12-06 DIAGNOSIS — G894 Chronic pain syndrome: Secondary | ICD-10-CM | POA: Diagnosis not present

## 2018-01-03 DIAGNOSIS — G894 Chronic pain syndrome: Secondary | ICD-10-CM | POA: Diagnosis not present

## 2018-01-03 DIAGNOSIS — M47816 Spondylosis without myelopathy or radiculopathy, lumbar region: Secondary | ICD-10-CM | POA: Diagnosis not present

## 2018-01-03 DIAGNOSIS — M47812 Spondylosis without myelopathy or radiculopathy, cervical region: Secondary | ICD-10-CM | POA: Diagnosis not present

## 2018-02-02 DIAGNOSIS — G894 Chronic pain syndrome: Secondary | ICD-10-CM | POA: Diagnosis not present

## 2018-02-02 DIAGNOSIS — M47812 Spondylosis without myelopathy or radiculopathy, cervical region: Secondary | ICD-10-CM | POA: Diagnosis not present

## 2018-02-02 DIAGNOSIS — M47816 Spondylosis without myelopathy or radiculopathy, lumbar region: Secondary | ICD-10-CM | POA: Diagnosis not present

## 2018-02-02 DIAGNOSIS — M7061 Trochanteric bursitis, right hip: Secondary | ICD-10-CM | POA: Diagnosis not present

## 2018-02-13 DIAGNOSIS — Z79899 Other long term (current) drug therapy: Secondary | ICD-10-CM | POA: Diagnosis not present

## 2018-02-13 DIAGNOSIS — M0609 Rheumatoid arthritis without rheumatoid factor, multiple sites: Secondary | ICD-10-CM | POA: Diagnosis not present

## 2018-02-16 DIAGNOSIS — Z6839 Body mass index (BMI) 39.0-39.9, adult: Secondary | ICD-10-CM | POA: Diagnosis not present

## 2018-02-16 DIAGNOSIS — Z0000 Encounter for general adult medical examination without abnormal findings: Secondary | ICD-10-CM | POA: Diagnosis not present

## 2018-02-16 DIAGNOSIS — R399 Unspecified symptoms and signs involving the genitourinary system: Secondary | ICD-10-CM | POA: Diagnosis not present

## 2018-02-16 DIAGNOSIS — Z1231 Encounter for screening mammogram for malignant neoplasm of breast: Secondary | ICD-10-CM | POA: Diagnosis not present

## 2018-03-02 DIAGNOSIS — G894 Chronic pain syndrome: Secondary | ICD-10-CM | POA: Diagnosis not present

## 2018-03-02 DIAGNOSIS — M47816 Spondylosis without myelopathy or radiculopathy, lumbar region: Secondary | ICD-10-CM | POA: Diagnosis not present

## 2018-03-02 DIAGNOSIS — M47812 Spondylosis without myelopathy or radiculopathy, cervical region: Secondary | ICD-10-CM | POA: Diagnosis not present

## 2018-03-30 DIAGNOSIS — M47812 Spondylosis without myelopathy or radiculopathy, cervical region: Secondary | ICD-10-CM | POA: Diagnosis not present

## 2018-03-30 DIAGNOSIS — G894 Chronic pain syndrome: Secondary | ICD-10-CM | POA: Diagnosis not present

## 2018-03-30 DIAGNOSIS — M47816 Spondylosis without myelopathy or radiculopathy, lumbar region: Secondary | ICD-10-CM | POA: Diagnosis not present

## 2018-04-27 DIAGNOSIS — M47816 Spondylosis without myelopathy or radiculopathy, lumbar region: Secondary | ICD-10-CM | POA: Diagnosis not present

## 2018-04-27 DIAGNOSIS — G894 Chronic pain syndrome: Secondary | ICD-10-CM | POA: Diagnosis not present

## 2018-04-27 DIAGNOSIS — M47812 Spondylosis without myelopathy or radiculopathy, cervical region: Secondary | ICD-10-CM | POA: Diagnosis not present

## 2018-05-15 DIAGNOSIS — M25551 Pain in right hip: Secondary | ICD-10-CM | POA: Diagnosis not present

## 2018-05-15 DIAGNOSIS — Z79899 Other long term (current) drug therapy: Secondary | ICD-10-CM | POA: Diagnosis not present

## 2018-05-15 DIAGNOSIS — M0609 Rheumatoid arthritis without rheumatoid factor, multiple sites: Secondary | ICD-10-CM | POA: Diagnosis not present

## 2018-05-24 DIAGNOSIS — M47816 Spondylosis without myelopathy or radiculopathy, lumbar region: Secondary | ICD-10-CM | POA: Diagnosis not present

## 2018-05-24 DIAGNOSIS — G894 Chronic pain syndrome: Secondary | ICD-10-CM | POA: Diagnosis not present

## 2018-05-24 DIAGNOSIS — M47812 Spondylosis without myelopathy or radiculopathy, cervical region: Secondary | ICD-10-CM | POA: Diagnosis not present

## 2018-06-08 DIAGNOSIS — M7551 Bursitis of right shoulder: Secondary | ICD-10-CM | POA: Diagnosis not present

## 2019-02-22 ENCOUNTER — Other Ambulatory Visit: Payer: Self-pay | Admitting: Nurse Practitioner

## 2019-02-22 DIAGNOSIS — Z1231 Encounter for screening mammogram for malignant neoplasm of breast: Secondary | ICD-10-CM

## 2019-03-13 LAB — EXTERNAL GENERIC LAB PROCEDURE: COLOGUARD: NEGATIVE

## 2019-03-16 ENCOUNTER — Ambulatory Visit
Admission: RE | Admit: 2019-03-16 | Discharge: 2019-03-16 | Disposition: A | Discharge status: Home / Self Care | Payer: 59 | Source: Ambulatory Visit | Attending: Nurse Practitioner | Admitting: Nurse Practitioner

## 2019-03-16 ENCOUNTER — Other Ambulatory Visit: Payer: Self-pay

## 2019-03-16 DIAGNOSIS — Z1231 Encounter for screening mammogram for malignant neoplasm of breast: Secondary | ICD-10-CM

## 2019-03-30 ENCOUNTER — Other Ambulatory Visit: Payer: Self-pay | Admitting: Physician Assistant

## 2019-03-30 DIAGNOSIS — U071 COVID-19: Secondary | ICD-10-CM

## 2019-03-30 DIAGNOSIS — M069 Rheumatoid arthritis, unspecified: Secondary | ICD-10-CM

## 2019-03-30 NOTE — Progress Notes (Signed)
  I connected by phone with [name] on 03/30/2019 at 3:23 PM to discuss the potential use of an new treatment for mild to moderate COVID-19 viral infection in non-hospitalized patients.  This patient is a 54 y.o. female that meets the FDA criteria for Emergency Use Authorization of bamlanivimab or casirivimab\imdevimab.  Has a (+) direct SARS-CoV-2 viral test result  Has mild or moderate COVID-19   Is ? 52 years of age and weighs ? 40 kg  Is NOT hospitalized due to COVID-19  Is NOT requiring oxygen therapy or requiring an increase in baseline oxygen flow rate due to COVID-19  Is within 10 days of symptom onset  Has at least one of the high risk factor(s) for progression to severe COVID-19 and/or hospitalization as defined in EUA.  Specific high risk criteria : Immunosuppressive Disease and on immunosuppressive therpay  Obesity    I have spoken and communicated the following to the patient or parent/caregiver:  1. FDA has authorized the emergency use of bamlanivimab and casirivimab\imdevimab for the treatment of mild to moderate COVID-19 in adults and pediatric patients with positive results of direct SARS-CoV-2 viral testing who are 28 years of age and older weighing at least 40 kg, and who are at high risk for progressing to severe COVID-19 and/or hospitalization.  2. The significant known and potential risks and benefits of bamlanivimab and casirivimab\imdevimab, and the extent to which such potential risks and benefits are unknown.  3. Information on available alternative treatments and the risks and benefits of those alternatives, including clinical trials.  4. Patients treated with bamlanivimab and casirivimab\imdevimab should continue to self-isolate and use infection control measures (e.g., wear mask, isolate, social distance, avoid sharing personal items, clean and disinfect "high touch" surfaces, and frequent handwashing) according to CDC guidelines.   5. The  patient or parent/caregiver has the option to accept or refuse bamlanivimab or casirivimab\imdevimab .  After reviewing this information with the patient, The patient agreed to proceed with receiving the bamlanimivab infusion and will be provided a copy of the Fact sheet prior to receiving the infusion.Sharrell Ku Ranyia Witting 03/30/2019 3:23 PM

## 2019-03-31 ENCOUNTER — Other Ambulatory Visit (HOSPITAL_COMMUNITY): Payer: Self-pay

## 2019-03-31 ENCOUNTER — Ambulatory Visit (HOSPITAL_COMMUNITY)
Admission: RE | Admit: 2019-03-31 | Discharge: 2019-03-31 | Disposition: A | Discharge status: Home / Self Care | Payer: 59 | Source: Ambulatory Visit | Attending: Pulmonary Disease | Admitting: Pulmonary Disease

## 2019-03-31 DIAGNOSIS — U071 COVID-19: Secondary | ICD-10-CM | POA: Diagnosis not present

## 2019-03-31 MED ORDER — ALBUTEROL SULFATE HFA 108 (90 BASE) MCG/ACT IN AERS: 2.0000 | INHALATION_SPRAY | Freq: Once | RESPIRATORY_TRACT | Status: DC | PRN

## 2019-03-31 MED ORDER — DIPHENHYDRAMINE HCL 50 MG/ML IJ SOLN: 50.0000 mg | Freq: Once | INTRAMUSCULAR | Status: DC | PRN

## 2019-03-31 MED ORDER — EPINEPHRINE 0.3 MG/0.3ML IJ SOAJ: 0.3000 mg | Freq: Once | INTRAMUSCULAR | Status: DC | PRN

## 2019-03-31 MED ORDER — METHYLPREDNISOLONE SODIUM SUCC 125 MG IJ SOLR: 125.0000 mg | Freq: Once | INTRAMUSCULAR | Status: DC | PRN

## 2019-03-31 MED ORDER — FAMOTIDINE IN NACL 20-0.9 MG/50ML-% IV SOLN: 20.0000 mg | Freq: Once | INTRAVENOUS | Status: DC | PRN

## 2019-03-31 MED ORDER — SODIUM CHLORIDE 0.9 % IV SOLN: INTRAVENOUS | Status: DC | PRN

## 2019-03-31 MED ORDER — SODIUM CHLORIDE 0.9 % IV SOLN
700.0000 mg | Freq: Once | INTRAVENOUS | Status: AC
  Administered 2019-03-31: 700 mg via INTRAVENOUS
  Filled 2019-03-31: qty 700

## 2019-03-31 NOTE — Discharge Instructions (Signed)

## 2019-03-31 NOTE — Progress Notes (Signed)
  Diagnosis: COVID-19  Physician: Dr. Wright  Procedure: [facility] Med: bamlanivimab infusion - Provided patient with bamlanimivab fact sheet for patients, parents and caregivers prior to infusion.  Complications: No immediate complications noted.  Discharge: Discharged home   Nellie Chevalier S Phelan Schadt 03/31/2019  

## 2020-02-07 DIAGNOSIS — G894 Chronic pain syndrome: Secondary | ICD-10-CM | POA: Diagnosis not present

## 2020-02-07 DIAGNOSIS — M47812 Spondylosis without myelopathy or radiculopathy, cervical region: Secondary | ICD-10-CM | POA: Diagnosis not present

## 2020-02-07 DIAGNOSIS — M069 Rheumatoid arthritis, unspecified: Secondary | ICD-10-CM | POA: Diagnosis not present

## 2020-02-07 DIAGNOSIS — M47816 Spondylosis without myelopathy or radiculopathy, lumbar region: Secondary | ICD-10-CM | POA: Diagnosis not present

## 2020-02-19 ENCOUNTER — Ambulatory Visit
Admission: RE | Admit: 2020-02-19 | Discharge: 2020-02-19 | Disposition: A | Discharge status: Home / Self Care | Payer: 59 | Source: Ambulatory Visit | Attending: Physical Medicine and Rehabilitation | Admitting: Physical Medicine and Rehabilitation

## 2020-02-19 ENCOUNTER — Other Ambulatory Visit: Payer: Self-pay | Admitting: Physical Medicine and Rehabilitation

## 2020-02-19 DIAGNOSIS — M1711 Unilateral primary osteoarthritis, right knee: Secondary | ICD-10-CM

## 2020-02-19 DIAGNOSIS — M25561 Pain in right knee: Secondary | ICD-10-CM | POA: Diagnosis not present

## 2020-02-26 ENCOUNTER — Other Ambulatory Visit: Payer: Self-pay | Admitting: Nurse Practitioner

## 2020-02-26 DIAGNOSIS — M8580 Other specified disorders of bone density and structure, unspecified site: Secondary | ICD-10-CM | POA: Diagnosis not present

## 2020-02-26 DIAGNOSIS — Z0000 Encounter for general adult medical examination without abnormal findings: Secondary | ICD-10-CM | POA: Diagnosis not present

## 2020-02-26 DIAGNOSIS — M069 Rheumatoid arthritis, unspecified: Secondary | ICD-10-CM | POA: Diagnosis not present

## 2020-02-26 DIAGNOSIS — Z1331 Encounter for screening for depression: Secondary | ICD-10-CM | POA: Diagnosis not present

## 2020-02-26 DIAGNOSIS — Z1231 Encounter for screening mammogram for malignant neoplasm of breast: Secondary | ICD-10-CM

## 2020-02-26 DIAGNOSIS — E559 Vitamin D deficiency, unspecified: Secondary | ICD-10-CM | POA: Diagnosis not present

## 2020-03-06 DIAGNOSIS — M47816 Spondylosis without myelopathy or radiculopathy, lumbar region: Secondary | ICD-10-CM | POA: Diagnosis not present

## 2020-03-06 DIAGNOSIS — G894 Chronic pain syndrome: Secondary | ICD-10-CM | POA: Diagnosis not present

## 2020-03-06 DIAGNOSIS — M069 Rheumatoid arthritis, unspecified: Secondary | ICD-10-CM | POA: Diagnosis not present

## 2020-03-06 DIAGNOSIS — M47812 Spondylosis without myelopathy or radiculopathy, cervical region: Secondary | ICD-10-CM | POA: Diagnosis not present

## 2020-04-03 DIAGNOSIS — M069 Rheumatoid arthritis, unspecified: Secondary | ICD-10-CM | POA: Diagnosis not present

## 2020-04-03 DIAGNOSIS — M47812 Spondylosis without myelopathy or radiculopathy, cervical region: Secondary | ICD-10-CM | POA: Diagnosis not present

## 2020-04-03 DIAGNOSIS — G894 Chronic pain syndrome: Secondary | ICD-10-CM | POA: Diagnosis not present

## 2020-04-03 DIAGNOSIS — M47816 Spondylosis without myelopathy or radiculopathy, lumbar region: Secondary | ICD-10-CM | POA: Diagnosis not present

## 2020-05-06 DIAGNOSIS — M47812 Spondylosis without myelopathy or radiculopathy, cervical region: Secondary | ICD-10-CM | POA: Diagnosis not present

## 2020-05-06 DIAGNOSIS — M069 Rheumatoid arthritis, unspecified: Secondary | ICD-10-CM | POA: Diagnosis not present

## 2020-05-06 DIAGNOSIS — M47816 Spondylosis without myelopathy or radiculopathy, lumbar region: Secondary | ICD-10-CM | POA: Diagnosis not present

## 2020-05-06 DIAGNOSIS — G894 Chronic pain syndrome: Secondary | ICD-10-CM | POA: Diagnosis not present

## 2020-05-06 DIAGNOSIS — Z79891 Long term (current) use of opiate analgesic: Secondary | ICD-10-CM | POA: Diagnosis not present

## 2020-06-04 DIAGNOSIS — M47812 Spondylosis without myelopathy or radiculopathy, cervical region: Secondary | ICD-10-CM | POA: Diagnosis not present

## 2020-06-04 DIAGNOSIS — G894 Chronic pain syndrome: Secondary | ICD-10-CM | POA: Diagnosis not present

## 2020-06-04 DIAGNOSIS — M069 Rheumatoid arthritis, unspecified: Secondary | ICD-10-CM | POA: Diagnosis not present

## 2020-06-04 DIAGNOSIS — M47816 Spondylosis without myelopathy or radiculopathy, lumbar region: Secondary | ICD-10-CM | POA: Diagnosis not present

## 2020-06-12 DIAGNOSIS — N390 Urinary tract infection, site not specified: Secondary | ICD-10-CM | POA: Diagnosis not present

## 2020-06-12 DIAGNOSIS — H6193 Disorder of external ear, unspecified, bilateral: Secondary | ICD-10-CM | POA: Diagnosis not present

## 2020-07-03 DIAGNOSIS — M47812 Spondylosis without myelopathy or radiculopathy, cervical region: Secondary | ICD-10-CM | POA: Diagnosis not present

## 2020-07-03 DIAGNOSIS — M069 Rheumatoid arthritis, unspecified: Secondary | ICD-10-CM | POA: Diagnosis not present

## 2020-07-03 DIAGNOSIS — G894 Chronic pain syndrome: Secondary | ICD-10-CM | POA: Diagnosis not present

## 2020-07-03 DIAGNOSIS — M47816 Spondylosis without myelopathy or radiculopathy, lumbar region: Secondary | ICD-10-CM | POA: Diagnosis not present

## 2020-08-01 DIAGNOSIS — M069 Rheumatoid arthritis, unspecified: Secondary | ICD-10-CM | POA: Diagnosis not present

## 2020-08-01 DIAGNOSIS — M47816 Spondylosis without myelopathy or radiculopathy, lumbar region: Secondary | ICD-10-CM | POA: Diagnosis not present

## 2020-08-01 DIAGNOSIS — M47812 Spondylosis without myelopathy or radiculopathy, cervical region: Secondary | ICD-10-CM | POA: Diagnosis not present

## 2020-08-01 DIAGNOSIS — G894 Chronic pain syndrome: Secondary | ICD-10-CM | POA: Diagnosis not present

## 2020-09-02 DIAGNOSIS — M47816 Spondylosis without myelopathy or radiculopathy, lumbar region: Secondary | ICD-10-CM | POA: Diagnosis not present

## 2020-09-02 DIAGNOSIS — M47812 Spondylosis without myelopathy or radiculopathy, cervical region: Secondary | ICD-10-CM | POA: Diagnosis not present

## 2020-09-02 DIAGNOSIS — M069 Rheumatoid arthritis, unspecified: Secondary | ICD-10-CM | POA: Diagnosis not present

## 2020-09-02 DIAGNOSIS — G894 Chronic pain syndrome: Secondary | ICD-10-CM | POA: Diagnosis not present

## 2020-09-16 DIAGNOSIS — E559 Vitamin D deficiency, unspecified: Secondary | ICD-10-CM | POA: Diagnosis not present

## 2020-09-16 DIAGNOSIS — M069 Rheumatoid arthritis, unspecified: Secondary | ICD-10-CM | POA: Diagnosis not present

## 2020-09-16 DIAGNOSIS — F411 Generalized anxiety disorder: Secondary | ICD-10-CM | POA: Diagnosis not present

## 2020-09-16 DIAGNOSIS — I10 Essential (primary) hypertension: Secondary | ICD-10-CM | POA: Diagnosis not present

## 2020-10-01 DIAGNOSIS — M069 Rheumatoid arthritis, unspecified: Secondary | ICD-10-CM | POA: Diagnosis not present

## 2020-10-01 DIAGNOSIS — M47812 Spondylosis without myelopathy or radiculopathy, cervical region: Secondary | ICD-10-CM | POA: Diagnosis not present

## 2020-10-01 DIAGNOSIS — M47816 Spondylosis without myelopathy or radiculopathy, lumbar region: Secondary | ICD-10-CM | POA: Diagnosis not present

## 2020-10-01 DIAGNOSIS — G894 Chronic pain syndrome: Secondary | ICD-10-CM | POA: Diagnosis not present

## 2020-10-08 ENCOUNTER — Other Ambulatory Visit: Payer: Self-pay | Admitting: Physical Medicine and Rehabilitation

## 2020-10-09 ENCOUNTER — Other Ambulatory Visit: Payer: Self-pay | Admitting: Physical Medicine and Rehabilitation

## 2020-10-09 DIAGNOSIS — M25561 Pain in right knee: Secondary | ICD-10-CM

## 2020-10-29 DIAGNOSIS — M47812 Spondylosis without myelopathy or radiculopathy, cervical region: Secondary | ICD-10-CM | POA: Diagnosis not present

## 2020-10-29 DIAGNOSIS — M069 Rheumatoid arthritis, unspecified: Secondary | ICD-10-CM | POA: Diagnosis not present

## 2020-10-29 DIAGNOSIS — G894 Chronic pain syndrome: Secondary | ICD-10-CM | POA: Diagnosis not present

## 2020-10-29 DIAGNOSIS — M47816 Spondylosis without myelopathy or radiculopathy, lumbar region: Secondary | ICD-10-CM | POA: Diagnosis not present

## 2020-11-05 ENCOUNTER — Other Ambulatory Visit: Payer: Self-pay

## 2020-11-05 ENCOUNTER — Ambulatory Visit
Admission: RE | Admit: 2020-11-05 | Discharge: 2020-11-05 | Disposition: A | Discharge status: Home / Self Care | Payer: BC Managed Care – PPO | Source: Ambulatory Visit | Attending: Nurse Practitioner | Admitting: Nurse Practitioner

## 2020-11-05 DIAGNOSIS — Z1231 Encounter for screening mammogram for malignant neoplasm of breast: Secondary | ICD-10-CM | POA: Diagnosis not present

## 2020-11-27 DIAGNOSIS — M47812 Spondylosis without myelopathy or radiculopathy, cervical region: Secondary | ICD-10-CM | POA: Diagnosis not present

## 2020-11-27 DIAGNOSIS — M069 Rheumatoid arthritis, unspecified: Secondary | ICD-10-CM | POA: Diagnosis not present

## 2020-11-27 DIAGNOSIS — G894 Chronic pain syndrome: Secondary | ICD-10-CM | POA: Diagnosis not present

## 2020-11-27 DIAGNOSIS — M47816 Spondylosis without myelopathy or radiculopathy, lumbar region: Secondary | ICD-10-CM | POA: Diagnosis not present

## 2020-12-25 DIAGNOSIS — G894 Chronic pain syndrome: Secondary | ICD-10-CM | POA: Diagnosis not present

## 2020-12-25 DIAGNOSIS — M069 Rheumatoid arthritis, unspecified: Secondary | ICD-10-CM | POA: Diagnosis not present

## 2020-12-25 DIAGNOSIS — M47816 Spondylosis without myelopathy or radiculopathy, lumbar region: Secondary | ICD-10-CM | POA: Diagnosis not present

## 2020-12-25 DIAGNOSIS — M47812 Spondylosis without myelopathy or radiculopathy, cervical region: Secondary | ICD-10-CM | POA: Diagnosis not present

## 2021-01-06 DIAGNOSIS — H60503 Unspecified acute noninfective otitis externa, bilateral: Secondary | ICD-10-CM | POA: Diagnosis not present

## 2021-01-06 DIAGNOSIS — Z6841 Body Mass Index (BMI) 40.0 and over, adult: Secondary | ICD-10-CM | POA: Diagnosis not present

## 2021-01-22 DIAGNOSIS — M069 Rheumatoid arthritis, unspecified: Secondary | ICD-10-CM | POA: Diagnosis not present

## 2021-01-22 DIAGNOSIS — M47812 Spondylosis without myelopathy or radiculopathy, cervical region: Secondary | ICD-10-CM | POA: Diagnosis not present

## 2021-01-22 DIAGNOSIS — G894 Chronic pain syndrome: Secondary | ICD-10-CM | POA: Diagnosis not present

## 2021-01-22 DIAGNOSIS — M47816 Spondylosis without myelopathy or radiculopathy, lumbar region: Secondary | ICD-10-CM | POA: Diagnosis not present

## 2021-02-19 DIAGNOSIS — M47812 Spondylosis without myelopathy or radiculopathy, cervical region: Secondary | ICD-10-CM | POA: Diagnosis not present

## 2021-02-19 DIAGNOSIS — M7552 Bursitis of left shoulder: Secondary | ICD-10-CM | POA: Diagnosis not present

## 2021-02-19 DIAGNOSIS — M47816 Spondylosis without myelopathy or radiculopathy, lumbar region: Secondary | ICD-10-CM | POA: Diagnosis not present

## 2021-02-19 DIAGNOSIS — G894 Chronic pain syndrome: Secondary | ICD-10-CM | POA: Diagnosis not present

## 2021-02-19 DIAGNOSIS — Z79891 Long term (current) use of opiate analgesic: Secondary | ICD-10-CM | POA: Diagnosis not present

## 2021-02-19 DIAGNOSIS — M069 Rheumatoid arthritis, unspecified: Secondary | ICD-10-CM | POA: Diagnosis not present

## 2021-02-20 DIAGNOSIS — M2550 Pain in unspecified joint: Secondary | ICD-10-CM | POA: Diagnosis not present

## 2021-02-20 DIAGNOSIS — M0609 Rheumatoid arthritis without rheumatoid factor, multiple sites: Secondary | ICD-10-CM | POA: Diagnosis not present

## 2021-02-20 DIAGNOSIS — Z23 Encounter for immunization: Secondary | ICD-10-CM | POA: Diagnosis not present

## 2021-02-20 DIAGNOSIS — Z79620 Long term (current) use of immunosuppressive biologic: Secondary | ICD-10-CM | POA: Diagnosis not present

## 2021-02-20 DIAGNOSIS — Z79899 Other long term (current) drug therapy: Secondary | ICD-10-CM | POA: Diagnosis not present

## 2021-02-20 DIAGNOSIS — Z7952 Long term (current) use of systemic steroids: Secondary | ICD-10-CM | POA: Diagnosis not present

## 2021-03-19 DIAGNOSIS — M069 Rheumatoid arthritis, unspecified: Secondary | ICD-10-CM | POA: Diagnosis not present

## 2021-03-19 DIAGNOSIS — G894 Chronic pain syndrome: Secondary | ICD-10-CM | POA: Diagnosis not present

## 2021-03-19 DIAGNOSIS — M47812 Spondylosis without myelopathy or radiculopathy, cervical region: Secondary | ICD-10-CM | POA: Diagnosis not present

## 2021-03-19 DIAGNOSIS — M47816 Spondylosis without myelopathy or radiculopathy, lumbar region: Secondary | ICD-10-CM | POA: Diagnosis not present

## 2021-04-16 DIAGNOSIS — M47816 Spondylosis without myelopathy or radiculopathy, lumbar region: Secondary | ICD-10-CM | POA: Diagnosis not present

## 2021-04-16 DIAGNOSIS — M7552 Bursitis of left shoulder: Secondary | ICD-10-CM | POA: Diagnosis not present

## 2021-04-16 DIAGNOSIS — M069 Rheumatoid arthritis, unspecified: Secondary | ICD-10-CM | POA: Diagnosis not present

## 2021-04-16 DIAGNOSIS — M47812 Spondylosis without myelopathy or radiculopathy, cervical region: Secondary | ICD-10-CM | POA: Diagnosis not present

## 2021-04-16 DIAGNOSIS — G894 Chronic pain syndrome: Secondary | ICD-10-CM | POA: Diagnosis not present

## 2021-05-13 DIAGNOSIS — M47812 Spondylosis without myelopathy or radiculopathy, cervical region: Secondary | ICD-10-CM | POA: Diagnosis not present

## 2021-05-13 DIAGNOSIS — G894 Chronic pain syndrome: Secondary | ICD-10-CM | POA: Diagnosis not present

## 2021-05-13 DIAGNOSIS — M069 Rheumatoid arthritis, unspecified: Secondary | ICD-10-CM | POA: Diagnosis not present

## 2021-05-13 DIAGNOSIS — M47816 Spondylosis without myelopathy or radiculopathy, lumbar region: Secondary | ICD-10-CM | POA: Diagnosis not present

## 2021-06-10 DIAGNOSIS — M069 Rheumatoid arthritis, unspecified: Secondary | ICD-10-CM | POA: Diagnosis not present

## 2021-06-10 DIAGNOSIS — M47816 Spondylosis without myelopathy or radiculopathy, lumbar region: Secondary | ICD-10-CM | POA: Diagnosis not present

## 2021-06-10 DIAGNOSIS — G894 Chronic pain syndrome: Secondary | ICD-10-CM | POA: Diagnosis not present

## 2021-06-10 DIAGNOSIS — M47812 Spondylosis without myelopathy or radiculopathy, cervical region: Secondary | ICD-10-CM | POA: Diagnosis not present

## 2021-06-26 DIAGNOSIS — E559 Vitamin D deficiency, unspecified: Secondary | ICD-10-CM | POA: Diagnosis not present

## 2021-06-26 DIAGNOSIS — R7989 Other specified abnormal findings of blood chemistry: Secondary | ICD-10-CM | POA: Diagnosis not present

## 2021-06-26 DIAGNOSIS — Z6841 Body Mass Index (BMI) 40.0 and over, adult: Secondary | ICD-10-CM | POA: Diagnosis not present

## 2021-06-26 DIAGNOSIS — I10 Essential (primary) hypertension: Secondary | ICD-10-CM | POA: Diagnosis not present

## 2021-06-26 DIAGNOSIS — Z0000 Encounter for general adult medical examination without abnormal findings: Secondary | ICD-10-CM | POA: Diagnosis not present

## 2021-06-26 DIAGNOSIS — E785 Hyperlipidemia, unspecified: Secondary | ICD-10-CM | POA: Diagnosis not present

## 2021-07-09 DIAGNOSIS — G894 Chronic pain syndrome: Secondary | ICD-10-CM | POA: Diagnosis not present

## 2021-07-09 DIAGNOSIS — M47816 Spondylosis without myelopathy or radiculopathy, lumbar region: Secondary | ICD-10-CM | POA: Diagnosis not present

## 2021-07-09 DIAGNOSIS — M069 Rheumatoid arthritis, unspecified: Secondary | ICD-10-CM | POA: Diagnosis not present

## 2021-07-09 DIAGNOSIS — M47812 Spondylosis without myelopathy or radiculopathy, cervical region: Secondary | ICD-10-CM | POA: Diagnosis not present

## 2021-08-06 DIAGNOSIS — G894 Chronic pain syndrome: Secondary | ICD-10-CM | POA: Diagnosis not present

## 2021-08-06 DIAGNOSIS — M069 Rheumatoid arthritis, unspecified: Secondary | ICD-10-CM | POA: Diagnosis not present

## 2021-08-06 DIAGNOSIS — M47816 Spondylosis without myelopathy or radiculopathy, lumbar region: Secondary | ICD-10-CM | POA: Diagnosis not present

## 2021-08-06 DIAGNOSIS — M47812 Spondylosis without myelopathy or radiculopathy, cervical region: Secondary | ICD-10-CM | POA: Diagnosis not present

## 2021-09-03 DIAGNOSIS — M47812 Spondylosis without myelopathy or radiculopathy, cervical region: Secondary | ICD-10-CM | POA: Diagnosis not present

## 2021-09-03 DIAGNOSIS — M069 Rheumatoid arthritis, unspecified: Secondary | ICD-10-CM | POA: Diagnosis not present

## 2021-09-03 DIAGNOSIS — G894 Chronic pain syndrome: Secondary | ICD-10-CM | POA: Diagnosis not present

## 2021-09-03 DIAGNOSIS — M47816 Spondylosis without myelopathy or radiculopathy, lumbar region: Secondary | ICD-10-CM | POA: Diagnosis not present

## 2021-09-23 DIAGNOSIS — G894 Chronic pain syndrome: Secondary | ICD-10-CM | POA: Diagnosis not present

## 2021-09-23 DIAGNOSIS — M069 Rheumatoid arthritis, unspecified: Secondary | ICD-10-CM | POA: Diagnosis not present

## 2021-09-23 DIAGNOSIS — M47816 Spondylosis without myelopathy or radiculopathy, lumbar region: Secondary | ICD-10-CM | POA: Diagnosis not present

## 2021-09-23 DIAGNOSIS — M47812 Spondylosis without myelopathy or radiculopathy, cervical region: Secondary | ICD-10-CM | POA: Diagnosis not present

## 2021-10-28 DIAGNOSIS — G894 Chronic pain syndrome: Secondary | ICD-10-CM | POA: Diagnosis not present

## 2021-10-28 DIAGNOSIS — M47816 Spondylosis without myelopathy or radiculopathy, lumbar region: Secondary | ICD-10-CM | POA: Diagnosis not present

## 2021-10-28 DIAGNOSIS — M47812 Spondylosis without myelopathy or radiculopathy, cervical region: Secondary | ICD-10-CM | POA: Diagnosis not present

## 2021-10-28 DIAGNOSIS — M069 Rheumatoid arthritis, unspecified: Secondary | ICD-10-CM | POA: Diagnosis not present

## 2021-11-23 DIAGNOSIS — M2550 Pain in unspecified joint: Secondary | ICD-10-CM | POA: Diagnosis not present

## 2021-11-23 DIAGNOSIS — M0609 Rheumatoid arthritis without rheumatoid factor, multiple sites: Secondary | ICD-10-CM | POA: Diagnosis not present

## 2021-11-23 DIAGNOSIS — Z79899 Other long term (current) drug therapy: Secondary | ICD-10-CM | POA: Diagnosis not present

## 2021-11-25 DIAGNOSIS — M47816 Spondylosis without myelopathy or radiculopathy, lumbar region: Secondary | ICD-10-CM | POA: Diagnosis not present

## 2021-11-25 DIAGNOSIS — M47812 Spondylosis without myelopathy or radiculopathy, cervical region: Secondary | ICD-10-CM | POA: Diagnosis not present

## 2021-11-25 DIAGNOSIS — M069 Rheumatoid arthritis, unspecified: Secondary | ICD-10-CM | POA: Diagnosis not present

## 2021-11-25 DIAGNOSIS — G894 Chronic pain syndrome: Secondary | ICD-10-CM | POA: Diagnosis not present

## 2021-11-30 ENCOUNTER — Other Ambulatory Visit (HOSPITAL_COMMUNITY): Payer: Self-pay

## 2021-11-30 MED ORDER — OXYCODONE HCL 10 MG PO TABS
10.0000 mg | ORAL_TABLET | ORAL | 0 refills | Status: DC | PRN
  Filled 2021-11-30: qty 180, 30d supply, fill #0

## 2021-12-24 ENCOUNTER — Other Ambulatory Visit (HOSPITAL_COMMUNITY): Payer: Self-pay

## 2021-12-24 DIAGNOSIS — M47816 Spondylosis without myelopathy or radiculopathy, lumbar region: Secondary | ICD-10-CM | POA: Diagnosis not present

## 2021-12-24 DIAGNOSIS — M47812 Spondylosis without myelopathy or radiculopathy, cervical region: Secondary | ICD-10-CM | POA: Diagnosis not present

## 2021-12-24 DIAGNOSIS — M069 Rheumatoid arthritis, unspecified: Secondary | ICD-10-CM | POA: Diagnosis not present

## 2021-12-24 DIAGNOSIS — G894 Chronic pain syndrome: Secondary | ICD-10-CM | POA: Diagnosis not present

## 2021-12-24 MED ORDER — OXYCODONE HCL 10 MG PO TABS
10.0000 mg | ORAL_TABLET | ORAL | 0 refills | Status: AC | PRN
  Filled 2021-12-24 – 2021-12-28 (×2): qty 180, 30d supply, fill #0

## 2021-12-28 ENCOUNTER — Other Ambulatory Visit (HOSPITAL_COMMUNITY): Payer: Self-pay

## 2021-12-28 MED ORDER — MORPHINE SULFATE ER 15 MG PO TBCR
15.0000 mg | EXTENDED_RELEASE_TABLET | Freq: Three times a day (TID) | ORAL | 0 refills | Status: DC
  Filled 2021-12-28: qty 90, 30d supply, fill #0

## 2022-01-04 DIAGNOSIS — Z8639 Personal history of other endocrine, nutritional and metabolic disease: Secondary | ICD-10-CM | POA: Diagnosis not present

## 2022-01-04 DIAGNOSIS — Z6841 Body Mass Index (BMI) 40.0 and over, adult: Secondary | ICD-10-CM | POA: Diagnosis not present

## 2022-01-04 DIAGNOSIS — E559 Vitamin D deficiency, unspecified: Secondary | ICD-10-CM | POA: Diagnosis not present

## 2022-01-04 DIAGNOSIS — I10 Essential (primary) hypertension: Secondary | ICD-10-CM | POA: Diagnosis not present

## 2022-01-20 ENCOUNTER — Other Ambulatory Visit (HOSPITAL_COMMUNITY): Payer: Self-pay

## 2022-01-20 DIAGNOSIS — M47816 Spondylosis without myelopathy or radiculopathy, lumbar region: Secondary | ICD-10-CM | POA: Diagnosis not present

## 2022-01-20 DIAGNOSIS — M47812 Spondylosis without myelopathy or radiculopathy, cervical region: Secondary | ICD-10-CM | POA: Diagnosis not present

## 2022-01-20 DIAGNOSIS — G894 Chronic pain syndrome: Secondary | ICD-10-CM | POA: Diagnosis not present

## 2022-01-20 DIAGNOSIS — M069 Rheumatoid arthritis, unspecified: Secondary | ICD-10-CM | POA: Diagnosis not present

## 2022-01-20 MED ORDER — MORPHINE SULFATE ER 15 MG PO TBCR
15.0000 mg | EXTENDED_RELEASE_TABLET | Freq: Three times a day (TID) | ORAL | 0 refills | Status: DC
  Filled 2022-01-20 – 2022-01-25 (×2): qty 90, 30d supply, fill #0

## 2022-01-20 MED ORDER — OXYCODONE HCL 10 MG PO TABS
10.0000 mg | ORAL_TABLET | ORAL | 0 refills | Status: DC | PRN
  Filled 2022-01-20 – 2022-01-25 (×2): qty 180, 30d supply, fill #0

## 2022-01-25 ENCOUNTER — Other Ambulatory Visit (HOSPITAL_COMMUNITY): Payer: Self-pay

## 2022-02-15 DIAGNOSIS — I10 Essential (primary) hypertension: Secondary | ICD-10-CM | POA: Diagnosis not present

## 2022-02-15 DIAGNOSIS — E559 Vitamin D deficiency, unspecified: Secondary | ICD-10-CM | POA: Diagnosis not present

## 2022-02-15 DIAGNOSIS — Z23 Encounter for immunization: Secondary | ICD-10-CM | POA: Diagnosis not present

## 2022-02-24 ENCOUNTER — Other Ambulatory Visit (HOSPITAL_COMMUNITY): Payer: Self-pay

## 2022-02-24 DIAGNOSIS — M47816 Spondylosis without myelopathy or radiculopathy, lumbar region: Secondary | ICD-10-CM | POA: Diagnosis not present

## 2022-02-24 DIAGNOSIS — G894 Chronic pain syndrome: Secondary | ICD-10-CM | POA: Diagnosis not present

## 2022-02-24 DIAGNOSIS — M069 Rheumatoid arthritis, unspecified: Secondary | ICD-10-CM | POA: Diagnosis not present

## 2022-02-24 DIAGNOSIS — M47812 Spondylosis without myelopathy or radiculopathy, cervical region: Secondary | ICD-10-CM | POA: Diagnosis not present

## 2022-02-24 MED ORDER — MORPHINE SULFATE ER 15 MG PO TBCR
15.0000 mg | EXTENDED_RELEASE_TABLET | Freq: Three times a day (TID) | ORAL | 0 refills | Status: DC
  Filled 2022-02-24: qty 90, 30d supply, fill #0

## 2022-02-24 MED ORDER — OXYCODONE HCL 10 MG PO TABS
10.0000 mg | ORAL_TABLET | ORAL | 0 refills | Status: DC | PRN
  Filled 2022-02-24: qty 180, 30d supply, fill #0

## 2022-03-24 ENCOUNTER — Other Ambulatory Visit (HOSPITAL_COMMUNITY): Payer: Self-pay

## 2022-03-24 DIAGNOSIS — G894 Chronic pain syndrome: Secondary | ICD-10-CM | POA: Diagnosis not present

## 2022-03-24 DIAGNOSIS — M47816 Spondylosis without myelopathy or radiculopathy, lumbar region: Secondary | ICD-10-CM | POA: Diagnosis not present

## 2022-03-24 DIAGNOSIS — M069 Rheumatoid arthritis, unspecified: Secondary | ICD-10-CM | POA: Diagnosis not present

## 2022-03-24 DIAGNOSIS — M47812 Spondylosis without myelopathy or radiculopathy, cervical region: Secondary | ICD-10-CM | POA: Diagnosis not present

## 2022-03-24 MED ORDER — MORPHINE SULFATE ER 15 MG PO TBCR
15.0000 mg | EXTENDED_RELEASE_TABLET | Freq: Three times a day (TID) | ORAL | 0 refills | Status: DC
  Filled 2022-03-24: qty 90, 30d supply, fill #0

## 2022-03-24 MED ORDER — OXYCODONE HCL 10 MG PO TABS
10.0000 mg | ORAL_TABLET | ORAL | 0 refills | Status: DC | PRN
  Filled 2022-03-24: qty 180, 30d supply, fill #0

## 2022-04-21 ENCOUNTER — Other Ambulatory Visit (HOSPITAL_COMMUNITY): Payer: Self-pay

## 2022-04-21 DIAGNOSIS — M069 Rheumatoid arthritis, unspecified: Secondary | ICD-10-CM | POA: Diagnosis not present

## 2022-04-21 DIAGNOSIS — M47812 Spondylosis without myelopathy or radiculopathy, cervical region: Secondary | ICD-10-CM | POA: Diagnosis not present

## 2022-04-21 DIAGNOSIS — M47816 Spondylosis without myelopathy or radiculopathy, lumbar region: Secondary | ICD-10-CM | POA: Diagnosis not present

## 2022-04-21 DIAGNOSIS — G894 Chronic pain syndrome: Secondary | ICD-10-CM | POA: Diagnosis not present

## 2022-04-21 MED ORDER — MORPHINE SULFATE ER 15 MG PO TBCR
15.0000 mg | EXTENDED_RELEASE_TABLET | Freq: Three times a day (TID) | ORAL | 0 refills | Status: DC
  Filled 2022-04-21: qty 90, 30d supply, fill #0

## 2022-04-21 MED ORDER — OXYCODONE HCL 10 MG PO TABS
10.0000 mg | ORAL_TABLET | ORAL | 0 refills | Status: DC | PRN
  Filled 2022-04-21: qty 180, 30d supply, fill #0

## 2022-04-22 ENCOUNTER — Other Ambulatory Visit (HOSPITAL_COMMUNITY): Payer: Self-pay

## 2022-05-19 ENCOUNTER — Other Ambulatory Visit (HOSPITAL_COMMUNITY): Payer: Self-pay

## 2022-05-19 DIAGNOSIS — G894 Chronic pain syndrome: Secondary | ICD-10-CM | POA: Diagnosis not present

## 2022-05-19 DIAGNOSIS — M069 Rheumatoid arthritis, unspecified: Secondary | ICD-10-CM | POA: Diagnosis not present

## 2022-05-19 DIAGNOSIS — M47816 Spondylosis without myelopathy or radiculopathy, lumbar region: Secondary | ICD-10-CM | POA: Diagnosis not present

## 2022-05-19 DIAGNOSIS — M47812 Spondylosis without myelopathy or radiculopathy, cervical region: Secondary | ICD-10-CM | POA: Diagnosis not present

## 2022-05-19 MED ORDER — OXYCODONE HCL 10 MG PO TABS
10.0000 mg | ORAL_TABLET | ORAL | 0 refills | Status: DC | PRN
  Filled 2022-05-21 (×2): qty 180, 30d supply, fill #0

## 2022-05-19 MED ORDER — MORPHINE SULFATE ER 15 MG PO TBCR
15.0000 mg | EXTENDED_RELEASE_TABLET | Freq: Three times a day (TID) | ORAL | 0 refills | Status: DC
  Filled 2022-05-19 – 2022-05-21 (×2): qty 90, 30d supply, fill #0

## 2022-05-20 ENCOUNTER — Other Ambulatory Visit (HOSPITAL_COMMUNITY): Payer: Self-pay

## 2022-05-21 ENCOUNTER — Other Ambulatory Visit (HOSPITAL_COMMUNITY): Payer: Self-pay

## 2022-05-21 ENCOUNTER — Other Ambulatory Visit: Payer: Self-pay

## 2022-05-31 DIAGNOSIS — Z79899 Other long term (current) drug therapy: Secondary | ICD-10-CM | POA: Diagnosis not present

## 2022-05-31 DIAGNOSIS — M2550 Pain in unspecified joint: Secondary | ICD-10-CM | POA: Diagnosis not present

## 2022-05-31 DIAGNOSIS — M0609 Rheumatoid arthritis without rheumatoid factor, multiple sites: Secondary | ICD-10-CM | POA: Diagnosis not present

## 2022-06-16 ENCOUNTER — Other Ambulatory Visit (HOSPITAL_COMMUNITY): Payer: Self-pay

## 2022-06-16 DIAGNOSIS — G894 Chronic pain syndrome: Secondary | ICD-10-CM | POA: Diagnosis not present

## 2022-06-16 DIAGNOSIS — M47812 Spondylosis without myelopathy or radiculopathy, cervical region: Secondary | ICD-10-CM | POA: Diagnosis not present

## 2022-06-16 DIAGNOSIS — M47816 Spondylosis without myelopathy or radiculopathy, lumbar region: Secondary | ICD-10-CM | POA: Diagnosis not present

## 2022-06-16 DIAGNOSIS — M069 Rheumatoid arthritis, unspecified: Secondary | ICD-10-CM | POA: Diagnosis not present

## 2022-06-16 MED ORDER — MORPHINE SULFATE ER 15 MG PO TBCR
15.0000 mg | EXTENDED_RELEASE_TABLET | Freq: Three times a day (TID) | ORAL | 0 refills | Status: DC
  Filled 2022-06-18 – 2022-06-19 (×2): qty 90, 30d supply, fill #0

## 2022-06-16 MED ORDER — OXYCODONE HCL 10 MG PO TABS
10.0000 mg | ORAL_TABLET | ORAL | 0 refills | Status: AC | PRN
  Filled 2022-06-18: qty 180, 30d supply, fill #0

## 2022-06-18 ENCOUNTER — Other Ambulatory Visit (HOSPITAL_COMMUNITY): Payer: Self-pay

## 2022-06-19 ENCOUNTER — Other Ambulatory Visit (HOSPITAL_COMMUNITY): Payer: Self-pay

## 2022-07-14 ENCOUNTER — Other Ambulatory Visit: Payer: Self-pay

## 2022-07-14 ENCOUNTER — Other Ambulatory Visit (HOSPITAL_COMMUNITY): Payer: Self-pay

## 2022-07-14 DIAGNOSIS — G894 Chronic pain syndrome: Secondary | ICD-10-CM | POA: Diagnosis not present

## 2022-07-14 DIAGNOSIS — M47812 Spondylosis without myelopathy or radiculopathy, cervical region: Secondary | ICD-10-CM | POA: Diagnosis not present

## 2022-07-14 DIAGNOSIS — M069 Rheumatoid arthritis, unspecified: Secondary | ICD-10-CM | POA: Diagnosis not present

## 2022-07-14 DIAGNOSIS — M47816 Spondylosis without myelopathy or radiculopathy, lumbar region: Secondary | ICD-10-CM | POA: Diagnosis not present

## 2022-07-14 MED ORDER — OXYCODONE HCL 10 MG PO TABS
10.0000 mg | ORAL_TABLET | ORAL | 0 refills | Status: AC | PRN
  Filled 2022-07-17 (×2): qty 180, 30d supply, fill #0

## 2022-07-14 MED ORDER — MORPHINE SULFATE ER 15 MG PO TBCR
15.0000 mg | EXTENDED_RELEASE_TABLET | Freq: Three times a day (TID) | ORAL | 0 refills | Status: DC
  Filled 2022-07-17 – 2022-07-19 (×2): qty 90, 30d supply, fill #0

## 2022-07-16 ENCOUNTER — Other Ambulatory Visit (HOSPITAL_COMMUNITY): Payer: Self-pay

## 2022-07-17 ENCOUNTER — Other Ambulatory Visit (HOSPITAL_COMMUNITY): Payer: Self-pay

## 2022-07-19 ENCOUNTER — Other Ambulatory Visit (HOSPITAL_COMMUNITY): Payer: Self-pay

## 2022-07-19 ENCOUNTER — Other Ambulatory Visit: Payer: Self-pay

## 2022-08-11 ENCOUNTER — Other Ambulatory Visit (HOSPITAL_COMMUNITY): Payer: Self-pay

## 2022-08-11 DIAGNOSIS — M47816 Spondylosis without myelopathy or radiculopathy, lumbar region: Secondary | ICD-10-CM | POA: Diagnosis not present

## 2022-08-11 DIAGNOSIS — M47812 Spondylosis without myelopathy or radiculopathy, cervical region: Secondary | ICD-10-CM | POA: Diagnosis not present

## 2022-08-11 DIAGNOSIS — G894 Chronic pain syndrome: Secondary | ICD-10-CM | POA: Diagnosis not present

## 2022-08-11 DIAGNOSIS — M069 Rheumatoid arthritis, unspecified: Secondary | ICD-10-CM | POA: Diagnosis not present

## 2022-08-11 MED ORDER — OXYCODONE HCL 10 MG PO TABS
10.0000 mg | ORAL_TABLET | ORAL | 0 refills | Status: AC | PRN
  Filled 2022-08-19 (×2): qty 180, 30d supply, fill #0

## 2022-08-11 MED ORDER — MORPHINE SULFATE ER 15 MG PO TBCR
15.0000 mg | EXTENDED_RELEASE_TABLET | Freq: Three times a day (TID) | ORAL | 0 refills | Status: AC
  Filled 2022-08-19 (×2): qty 90, 30d supply, fill #0

## 2022-08-12 ENCOUNTER — Other Ambulatory Visit (HOSPITAL_COMMUNITY): Payer: Self-pay

## 2022-08-16 IMAGING — CR DG KNEE COMPLETE 4+V*R*
4 series · 4 of 4 positions shown · non-contrast
Comparison: None.

CLINICAL DATA: Right knee pain

EXAM:
RIGHT KNEE - COMPLETE 4+ VIEW

[t knee ap right]
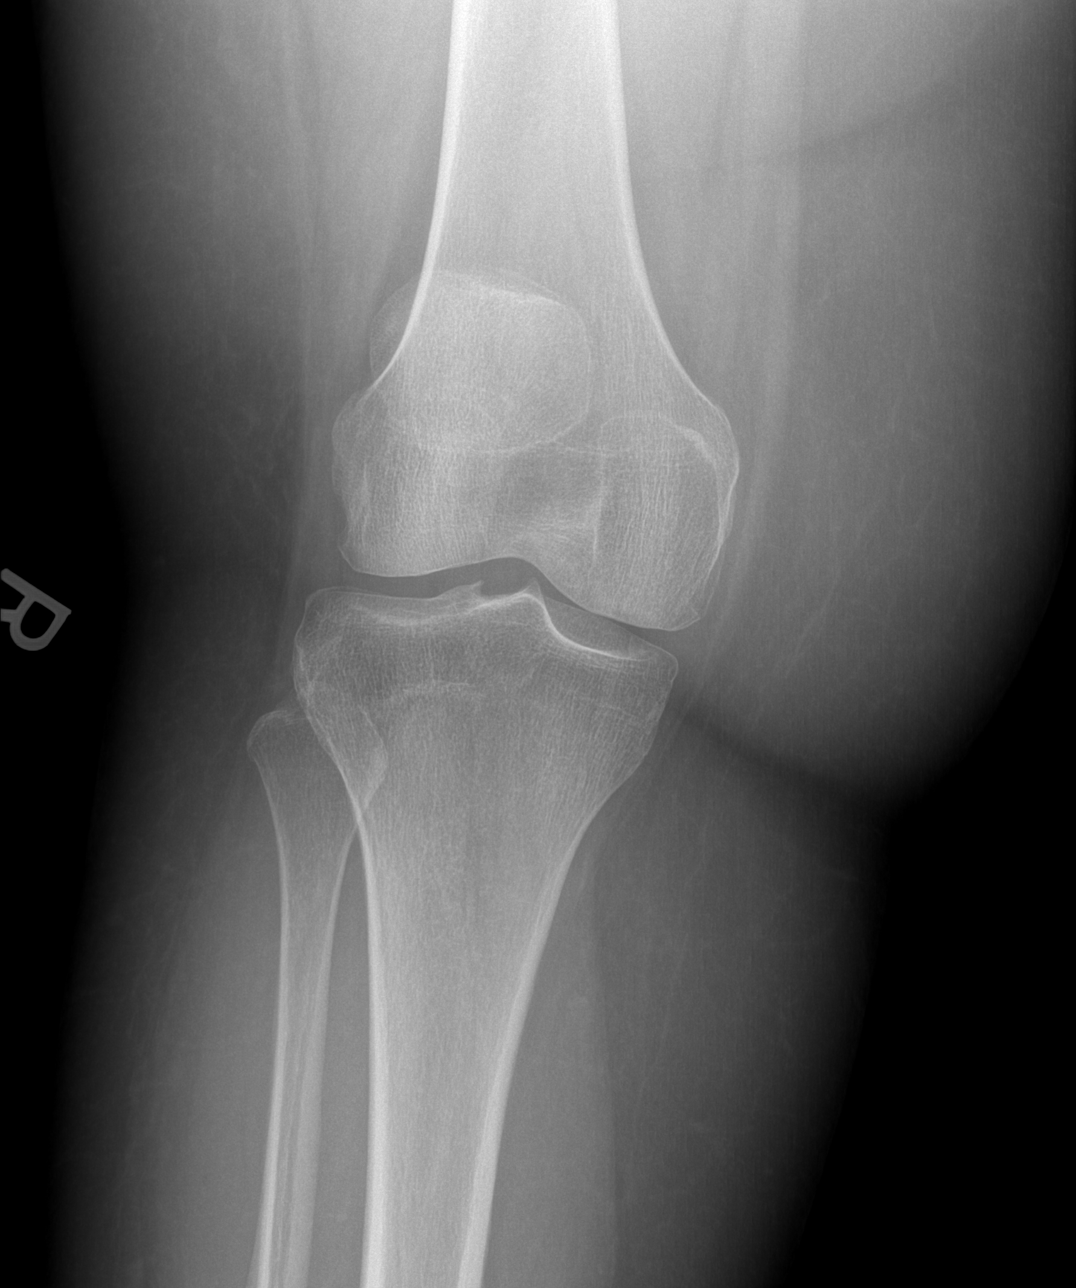

[t knee oblique right (1 of 2)]
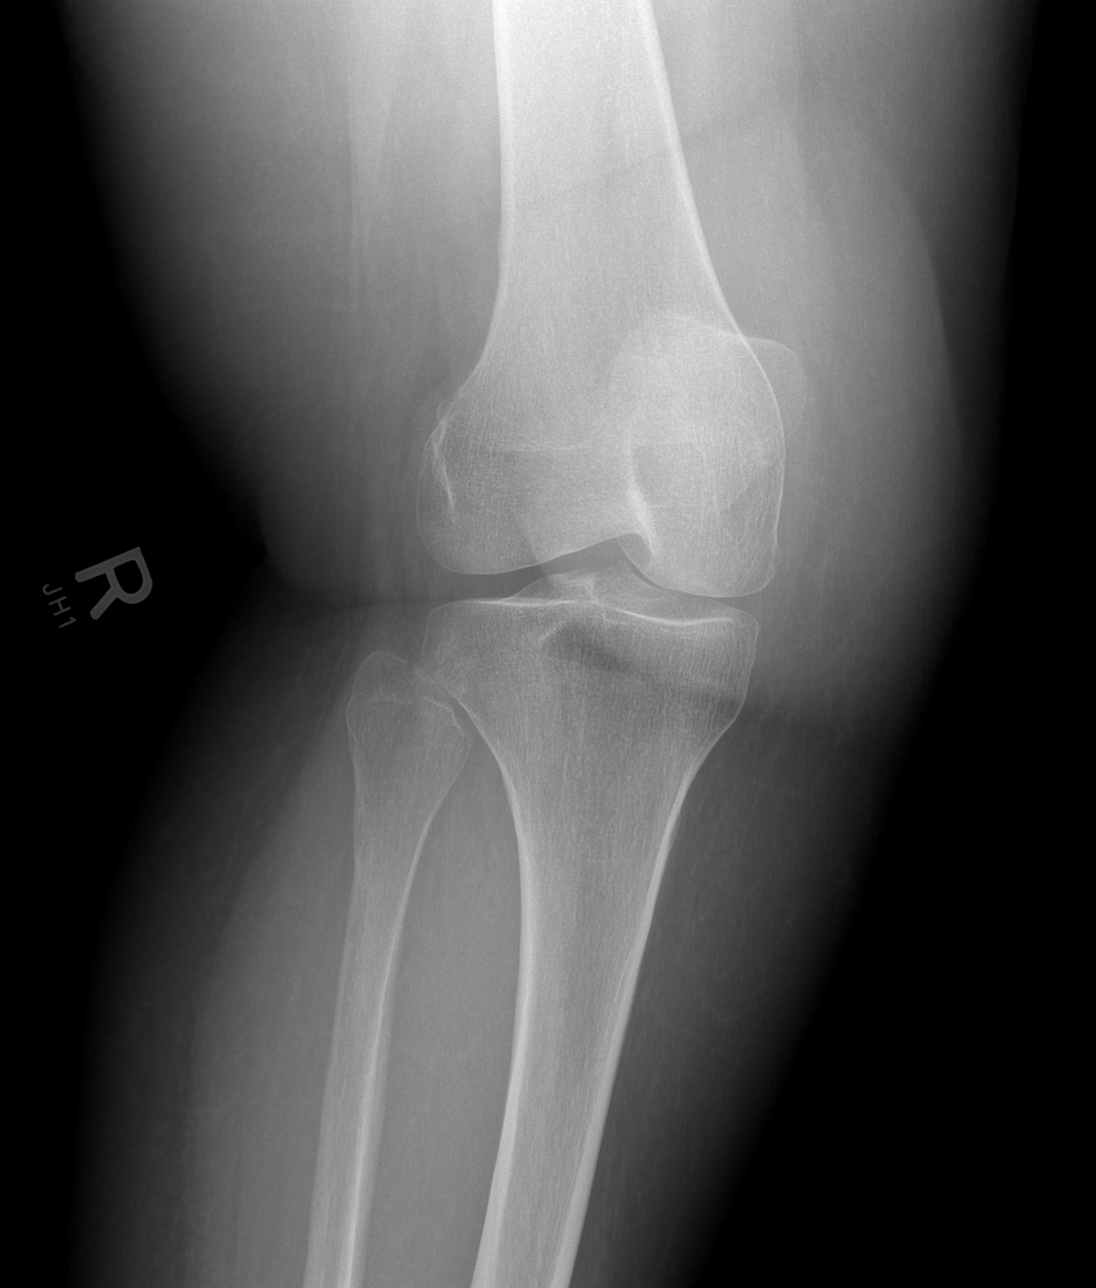

[t knee oblique right (2 of 2)]
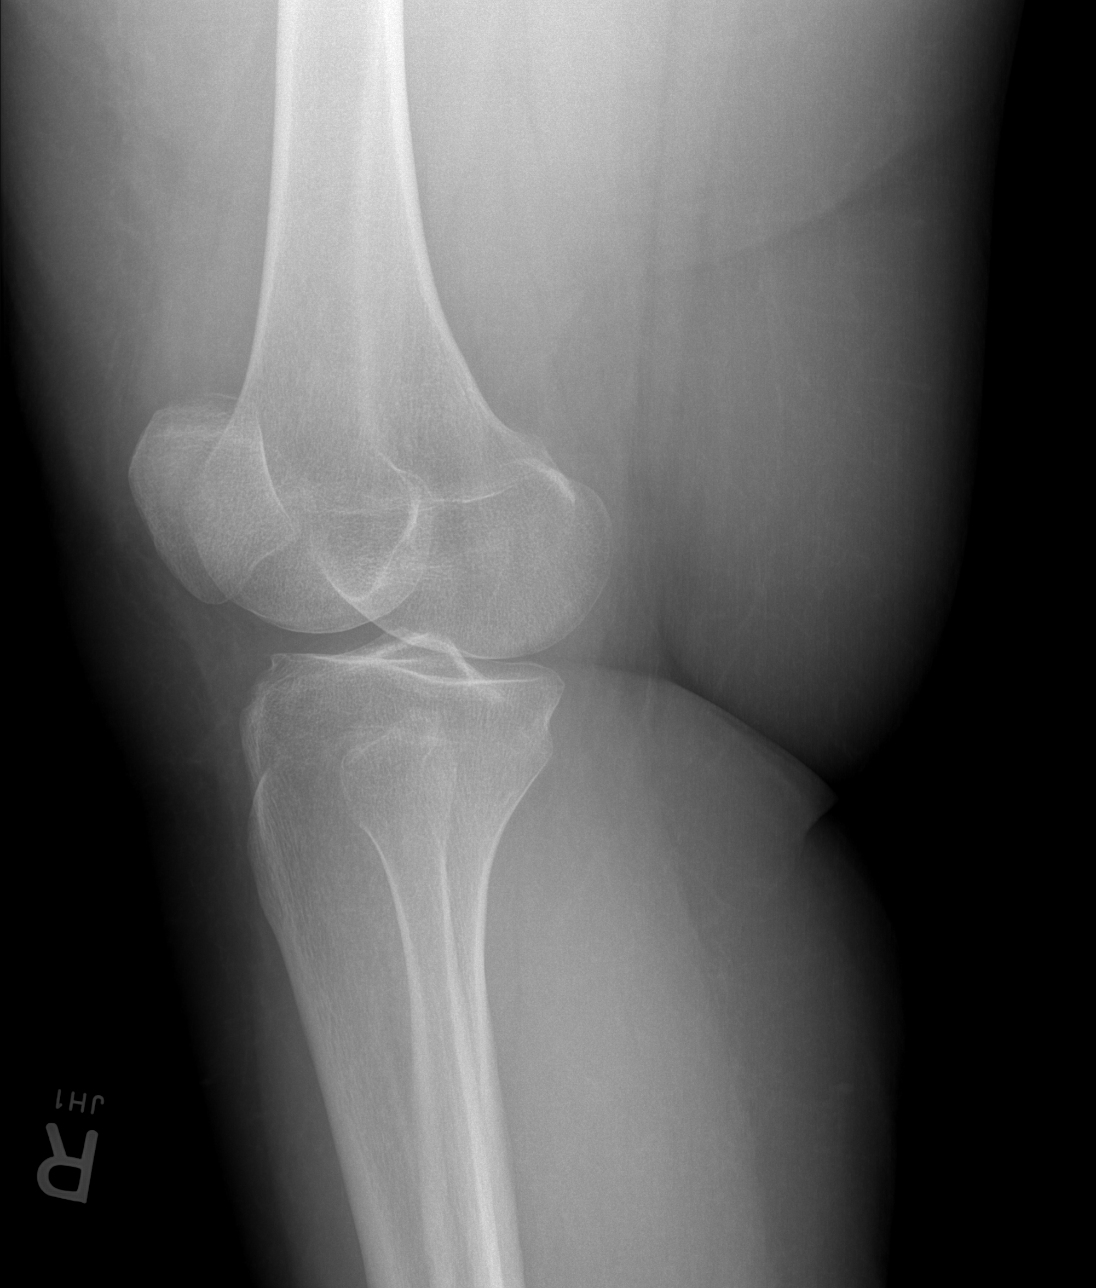

[t knee lat right]
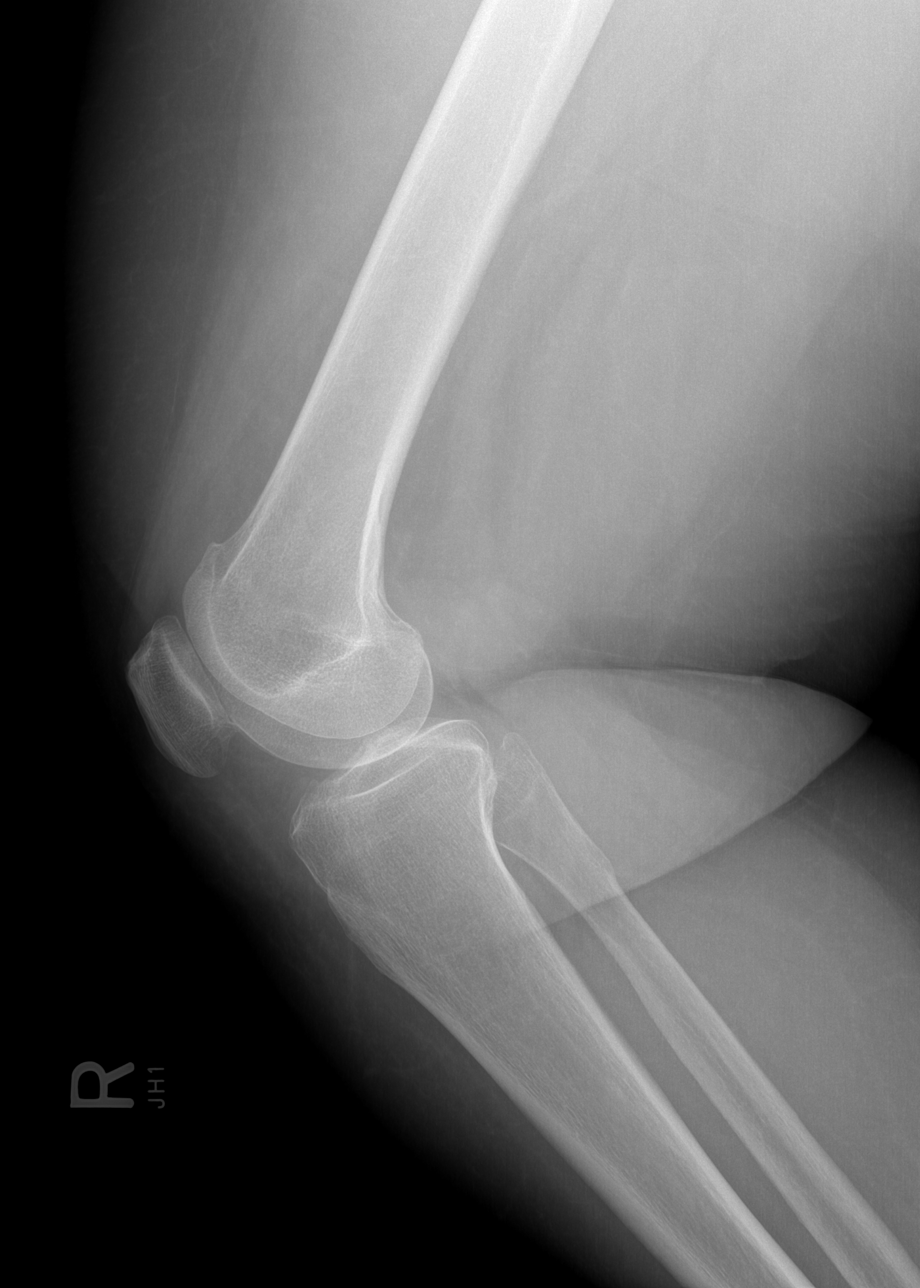

[4 of 4 positions shown; findings below may reference images not displayed]

FINDINGS: No evidence of fracture, dislocation, or joint effusion. No evidence
of arthropathy or other focal bone abnormality. Soft tissues are
unremarkable.
IMPRESSION: No acute osseous injury of the right knee.

## 2022-08-19 ENCOUNTER — Other Ambulatory Visit (HOSPITAL_COMMUNITY): Payer: Self-pay

## 2022-08-31 ENCOUNTER — Other Ambulatory Visit (HOSPITAL_COMMUNITY): Payer: Self-pay

## 2022-09-16 ENCOUNTER — Other Ambulatory Visit (HOSPITAL_COMMUNITY): Payer: Self-pay

## 2022-09-16 DIAGNOSIS — G894 Chronic pain syndrome: Secondary | ICD-10-CM | POA: Diagnosis not present

## 2022-09-16 DIAGNOSIS — M47816 Spondylosis without myelopathy or radiculopathy, lumbar region: Secondary | ICD-10-CM | POA: Diagnosis not present

## 2022-09-16 DIAGNOSIS — M069 Rheumatoid arthritis, unspecified: Secondary | ICD-10-CM | POA: Diagnosis not present

## 2022-09-16 DIAGNOSIS — M47812 Spondylosis without myelopathy or radiculopathy, cervical region: Secondary | ICD-10-CM | POA: Diagnosis not present

## 2022-09-16 MED ORDER — MORPHINE SULFATE ER 15 MG PO TBCR
15.0000 mg | EXTENDED_RELEASE_TABLET | Freq: Three times a day (TID) | ORAL | 0 refills | Status: DC
  Filled 2022-10-15: qty 90, 30d supply, fill #0

## 2022-09-16 MED ORDER — OXYCODONE HCL 10 MG PO TABS
10.0000 mg | ORAL_TABLET | ORAL | 0 refills | Status: AC | PRN
  Filled 2022-09-16: qty 155, 25d supply, fill #0
  Filled 2022-09-16: qty 25, 5d supply, fill #0

## 2022-09-16 MED ORDER — OXYCODONE HCL 10 MG PO TABS
10.0000 mg | ORAL_TABLET | ORAL | 0 refills | Status: AC | PRN
  Filled 2022-10-15: qty 180, 30d supply, fill #0

## 2022-09-16 MED ORDER — MORPHINE SULFATE ER 15 MG PO TBCR
15.0000 mg | EXTENDED_RELEASE_TABLET | Freq: Three times a day (TID) | ORAL | 0 refills | Status: AC
  Filled 2022-09-16: qty 90, 30d supply, fill #0

## 2022-09-17 ENCOUNTER — Other Ambulatory Visit (HOSPITAL_COMMUNITY): Payer: Self-pay

## 2022-10-07 DIAGNOSIS — L308 Other specified dermatitis: Secondary | ICD-10-CM | POA: Diagnosis not present

## 2022-10-08 DIAGNOSIS — L299 Pruritus, unspecified: Secondary | ICD-10-CM | POA: Diagnosis not present

## 2022-10-08 DIAGNOSIS — D485 Neoplasm of uncertain behavior of skin: Secondary | ICD-10-CM | POA: Diagnosis not present

## 2022-10-13 ENCOUNTER — Other Ambulatory Visit (HOSPITAL_COMMUNITY): Payer: Self-pay

## 2022-10-15 ENCOUNTER — Other Ambulatory Visit (HOSPITAL_COMMUNITY): Payer: Self-pay

## 2022-10-22 DIAGNOSIS — L219 Seborrheic dermatitis, unspecified: Secondary | ICD-10-CM | POA: Diagnosis not present

## 2022-10-22 DIAGNOSIS — L230 Allergic contact dermatitis due to metals: Secondary | ICD-10-CM | POA: Diagnosis not present

## 2022-10-27 ENCOUNTER — Other Ambulatory Visit: Payer: Self-pay | Admitting: Nurse Practitioner

## 2022-10-27 DIAGNOSIS — M8580 Other specified disorders of bone density and structure, unspecified site: Secondary | ICD-10-CM

## 2022-10-27 DIAGNOSIS — R7989 Other specified abnormal findings of blood chemistry: Secondary | ICD-10-CM | POA: Diagnosis not present

## 2022-10-27 DIAGNOSIS — Z1331 Encounter for screening for depression: Secondary | ICD-10-CM | POA: Diagnosis not present

## 2022-10-27 DIAGNOSIS — Z23 Encounter for immunization: Secondary | ICD-10-CM | POA: Diagnosis not present

## 2022-10-27 DIAGNOSIS — M069 Rheumatoid arthritis, unspecified: Secondary | ICD-10-CM | POA: Diagnosis not present

## 2022-10-27 DIAGNOSIS — E782 Mixed hyperlipidemia: Secondary | ICD-10-CM | POA: Diagnosis not present

## 2022-10-27 DIAGNOSIS — I10 Essential (primary) hypertension: Secondary | ICD-10-CM | POA: Diagnosis not present

## 2022-10-27 DIAGNOSIS — Z0000 Encounter for general adult medical examination without abnormal findings: Secondary | ICD-10-CM | POA: Diagnosis not present

## 2022-10-27 DIAGNOSIS — Z1231 Encounter for screening mammogram for malignant neoplasm of breast: Secondary | ICD-10-CM

## 2022-10-27 DIAGNOSIS — E559 Vitamin D deficiency, unspecified: Secondary | ICD-10-CM | POA: Diagnosis not present

## 2022-10-28 ENCOUNTER — Ambulatory Visit
Admission: RE | Admit: 2022-10-28 | Discharge: 2022-10-28 | Disposition: A | Discharge status: Home / Self Care | Payer: BC Managed Care – PPO | Source: Ambulatory Visit | Attending: Nurse Practitioner | Admitting: Nurse Practitioner

## 2022-10-28 DIAGNOSIS — Z1231 Encounter for screening mammogram for malignant neoplasm of breast: Secondary | ICD-10-CM | POA: Diagnosis not present

## 2022-11-04 DIAGNOSIS — Z1211 Encounter for screening for malignant neoplasm of colon: Secondary | ICD-10-CM | POA: Diagnosis not present

## 2022-11-04 DIAGNOSIS — Z1212 Encounter for screening for malignant neoplasm of rectum: Secondary | ICD-10-CM | POA: Diagnosis not present

## 2022-11-09 ENCOUNTER — Other Ambulatory Visit (HOSPITAL_COMMUNITY): Payer: Self-pay

## 2022-11-09 DIAGNOSIS — M47812 Spondylosis without myelopathy or radiculopathy, cervical region: Secondary | ICD-10-CM | POA: Diagnosis not present

## 2022-11-09 DIAGNOSIS — M069 Rheumatoid arthritis, unspecified: Secondary | ICD-10-CM | POA: Diagnosis not present

## 2022-11-09 DIAGNOSIS — M47816 Spondylosis without myelopathy or radiculopathy, lumbar region: Secondary | ICD-10-CM | POA: Diagnosis not present

## 2022-11-09 DIAGNOSIS — G894 Chronic pain syndrome: Secondary | ICD-10-CM | POA: Diagnosis not present

## 2022-11-09 MED ORDER — MORPHINE SULFATE ER 15 MG PO TBCR
15.0000 mg | EXTENDED_RELEASE_TABLET | Freq: Three times a day (TID) | ORAL | 0 refills | Status: DC
  Filled 2022-11-12 (×2): qty 90, 30d supply, fill #0

## 2022-11-09 MED ORDER — OXYCODONE HCL 10 MG PO TABS
10.0000 mg | ORAL_TABLET | ORAL | 0 refills | Status: AC | PRN
  Filled 2022-11-12 (×2): qty 180, 30d supply, fill #0

## 2022-11-12 ENCOUNTER — Other Ambulatory Visit (HOSPITAL_COMMUNITY): Payer: Self-pay

## 2022-11-13 LAB — COLOGUARD: COLOGUARD: NEGATIVE

## 2022-11-13 LAB — EXTERNAL GENERIC LAB PROCEDURE: COLOGUARD: NEGATIVE

## 2022-11-17 DIAGNOSIS — E66813 Obesity, class 3: Secondary | ICD-10-CM | POA: Diagnosis not present

## 2022-12-09 ENCOUNTER — Other Ambulatory Visit (HOSPITAL_COMMUNITY): Payer: Self-pay

## 2022-12-09 ENCOUNTER — Other Ambulatory Visit: Payer: Self-pay

## 2022-12-09 DIAGNOSIS — M47816 Spondylosis without myelopathy or radiculopathy, lumbar region: Secondary | ICD-10-CM | POA: Diagnosis not present

## 2022-12-09 DIAGNOSIS — E66813 Obesity, class 3: Secondary | ICD-10-CM | POA: Diagnosis not present

## 2022-12-09 DIAGNOSIS — M069 Rheumatoid arthritis, unspecified: Secondary | ICD-10-CM | POA: Diagnosis not present

## 2022-12-09 DIAGNOSIS — M47812 Spondylosis without myelopathy or radiculopathy, cervical region: Secondary | ICD-10-CM | POA: Diagnosis not present

## 2022-12-09 DIAGNOSIS — G894 Chronic pain syndrome: Secondary | ICD-10-CM | POA: Diagnosis not present

## 2022-12-09 MED ORDER — OXYCODONE HCL 10 MG PO TABS
10.0000 mg | ORAL_TABLET | ORAL | 0 refills | Status: DC | PRN
  Filled 2022-12-10: qty 180, 30d supply, fill #0

## 2022-12-09 MED ORDER — MORPHINE SULFATE ER 15 MG PO TBCR
15.0000 mg | EXTENDED_RELEASE_TABLET | Freq: Three times a day (TID) | ORAL | 0 refills | Status: DC
  Filled 2022-12-10: qty 90, 30d supply, fill #0

## 2022-12-10 ENCOUNTER — Other Ambulatory Visit (HOSPITAL_COMMUNITY): Payer: Self-pay

## 2022-12-28 DIAGNOSIS — E039 Hypothyroidism, unspecified: Secondary | ICD-10-CM | POA: Diagnosis not present

## 2023-01-05 ENCOUNTER — Other Ambulatory Visit (HOSPITAL_COMMUNITY): Payer: Self-pay

## 2023-01-05 MED ORDER — MORPHINE SULFATE ER 15 MG PO TBCR
15.0000 mg | EXTENDED_RELEASE_TABLET | Freq: Three times a day (TID) | ORAL | 0 refills | Status: DC
  Filled 2023-01-05 – 2023-01-07 (×2): qty 90, 30d supply, fill #0

## 2023-01-05 MED ORDER — OXYCODONE HCL 10 MG PO TABS
10.0000 mg | ORAL_TABLET | ORAL | 0 refills | Status: AC | PRN
  Filled 2023-01-05 – 2023-01-07 (×2): qty 180, 30d supply, fill #0

## 2023-01-06 ENCOUNTER — Other Ambulatory Visit (HOSPITAL_COMMUNITY): Payer: Self-pay

## 2023-01-07 ENCOUNTER — Other Ambulatory Visit (HOSPITAL_COMMUNITY): Payer: Self-pay

## 2023-01-11 ENCOUNTER — Other Ambulatory Visit (HOSPITAL_COMMUNITY): Payer: Self-pay

## 2023-01-13 ENCOUNTER — Other Ambulatory Visit (HOSPITAL_COMMUNITY): Payer: Self-pay

## 2023-01-14 ENCOUNTER — Other Ambulatory Visit (HOSPITAL_COMMUNITY): Payer: Self-pay

## 2023-01-14 ENCOUNTER — Other Ambulatory Visit: Payer: Self-pay

## 2023-02-03 ENCOUNTER — Other Ambulatory Visit (HOSPITAL_COMMUNITY): Payer: Self-pay

## 2023-02-03 DIAGNOSIS — M47812 Spondylosis without myelopathy or radiculopathy, cervical region: Secondary | ICD-10-CM | POA: Diagnosis not present

## 2023-02-03 DIAGNOSIS — M47816 Spondylosis without myelopathy or radiculopathy, lumbar region: Secondary | ICD-10-CM | POA: Diagnosis not present

## 2023-02-03 DIAGNOSIS — M069 Rheumatoid arthritis, unspecified: Secondary | ICD-10-CM | POA: Diagnosis not present

## 2023-02-03 DIAGNOSIS — G894 Chronic pain syndrome: Secondary | ICD-10-CM | POA: Diagnosis not present

## 2023-02-03 MED ORDER — OXYCODONE HCL 10 MG PO TABS
10.0000 mg | ORAL_TABLET | ORAL | 0 refills | Status: DC | PRN
  Filled 2023-02-03 (×2): qty 180, 30d supply, fill #0

## 2023-02-03 MED ORDER — MORPHINE SULFATE ER 15 MG PO TBCR
15.0000 mg | EXTENDED_RELEASE_TABLET | Freq: Three times a day (TID) | ORAL | 0 refills | Status: DC
  Filled 2023-02-03 – 2023-02-11 (×2): qty 90, 30d supply, fill #0

## 2023-02-04 ENCOUNTER — Other Ambulatory Visit (HOSPITAL_COMMUNITY): Payer: Self-pay

## 2023-02-11 ENCOUNTER — Other Ambulatory Visit (HOSPITAL_COMMUNITY): Payer: Self-pay

## 2023-02-11 ENCOUNTER — Other Ambulatory Visit: Payer: Self-pay

## 2023-02-21 DIAGNOSIS — M0609 Rheumatoid arthritis without rheumatoid factor, multiple sites: Secondary | ICD-10-CM | POA: Diagnosis not present

## 2023-02-21 DIAGNOSIS — Z79899 Other long term (current) drug therapy: Secondary | ICD-10-CM | POA: Diagnosis not present

## 2023-02-21 DIAGNOSIS — M2550 Pain in unspecified joint: Secondary | ICD-10-CM | POA: Diagnosis not present

## 2023-03-03 ENCOUNTER — Other Ambulatory Visit (HOSPITAL_COMMUNITY): Payer: Self-pay

## 2023-03-03 DIAGNOSIS — M47816 Spondylosis without myelopathy or radiculopathy, lumbar region: Secondary | ICD-10-CM | POA: Diagnosis not present

## 2023-03-03 DIAGNOSIS — M069 Rheumatoid arthritis, unspecified: Secondary | ICD-10-CM | POA: Diagnosis not present

## 2023-03-03 DIAGNOSIS — M47812 Spondylosis without myelopathy or radiculopathy, cervical region: Secondary | ICD-10-CM | POA: Diagnosis not present

## 2023-03-03 DIAGNOSIS — G894 Chronic pain syndrome: Secondary | ICD-10-CM | POA: Diagnosis not present

## 2023-03-03 MED ORDER — MORPHINE SULFATE ER 15 MG PO TBCR
15.0000 mg | EXTENDED_RELEASE_TABLET | Freq: Three times a day (TID) | ORAL | 0 refills | Status: DC
  Filled 2023-03-11: qty 90, 30d supply, fill #0

## 2023-03-03 MED ORDER — OXYCODONE HCL 10 MG PO TABS
10.0000 mg | ORAL_TABLET | ORAL | 0 refills | Status: DC | PRN
  Filled 2023-03-04: qty 180, 30d supply, fill #0

## 2023-03-04 ENCOUNTER — Other Ambulatory Visit (HOSPITAL_COMMUNITY): Payer: Self-pay

## 2023-03-11 ENCOUNTER — Other Ambulatory Visit (HOSPITAL_COMMUNITY): Payer: Self-pay

## 2023-03-11 ENCOUNTER — Other Ambulatory Visit (HOSPITAL_BASED_OUTPATIENT_CLINIC_OR_DEPARTMENT_OTHER): Payer: Self-pay

## 2023-03-14 ENCOUNTER — Other Ambulatory Visit (HOSPITAL_COMMUNITY): Payer: Self-pay

## 2023-03-14 MED ORDER — XTAMPZA ER 9 MG PO C12A
1.0000 | EXTENDED_RELEASE_CAPSULE | Freq: Two times a day (BID) | ORAL | 0 refills | Status: AC
  Filled 2023-03-14: qty 60, 30d supply, fill #0

## 2023-03-14 MED ORDER — BUPRENORPHINE 5 MCG/HR TD PTWK
MEDICATED_PATCH | TRANSDERMAL | 0 refills | Status: DC
  Filled 2023-03-14: qty 4, 28d supply, fill #0

## 2023-03-15 ENCOUNTER — Other Ambulatory Visit (HOSPITAL_COMMUNITY): Payer: Self-pay

## 2023-03-31 DIAGNOSIS — M47816 Spondylosis without myelopathy or radiculopathy, lumbar region: Secondary | ICD-10-CM | POA: Diagnosis not present

## 2023-03-31 DIAGNOSIS — M069 Rheumatoid arthritis, unspecified: Secondary | ICD-10-CM | POA: Diagnosis not present

## 2023-03-31 DIAGNOSIS — G894 Chronic pain syndrome: Secondary | ICD-10-CM | POA: Diagnosis not present

## 2023-03-31 DIAGNOSIS — M47812 Spondylosis without myelopathy or radiculopathy, cervical region: Secondary | ICD-10-CM | POA: Diagnosis not present

## 2023-03-31 DIAGNOSIS — Z79891 Long term (current) use of opiate analgesic: Secondary | ICD-10-CM | POA: Diagnosis not present

## 2023-04-13 DIAGNOSIS — Z79891 Long term (current) use of opiate analgesic: Secondary | ICD-10-CM | POA: Diagnosis not present

## 2023-04-13 DIAGNOSIS — G894 Chronic pain syndrome: Secondary | ICD-10-CM | POA: Diagnosis not present

## 2023-04-28 ENCOUNTER — Other Ambulatory Visit (HOSPITAL_COMMUNITY): Payer: Self-pay

## 2023-04-28 DIAGNOSIS — M47816 Spondylosis without myelopathy or radiculopathy, lumbar region: Secondary | ICD-10-CM | POA: Diagnosis not present

## 2023-04-28 DIAGNOSIS — G894 Chronic pain syndrome: Secondary | ICD-10-CM | POA: Diagnosis not present

## 2023-04-28 DIAGNOSIS — M069 Rheumatoid arthritis, unspecified: Secondary | ICD-10-CM | POA: Diagnosis not present

## 2023-04-28 DIAGNOSIS — M47812 Spondylosis without myelopathy or radiculopathy, cervical region: Secondary | ICD-10-CM | POA: Diagnosis not present

## 2023-04-28 MED ORDER — MORPHINE SULFATE ER 15 MG PO TBCR
15.0000 mg | EXTENDED_RELEASE_TABLET | Freq: Three times a day (TID) | ORAL | 0 refills | Status: DC
  Filled 2023-04-28: qty 90, 30d supply, fill #0

## 2023-04-28 MED ORDER — OXYCODONE HCL 10 MG PO TABS
10.0000 mg | ORAL_TABLET | ORAL | 0 refills | Status: DC | PRN
  Filled 2023-04-28: qty 180, 30d supply, fill #0

## 2023-05-24 ENCOUNTER — Inpatient Hospital Stay: Admission: RE | Admit: 2023-05-24 | Payer: BC Managed Care – PPO | Source: Ambulatory Visit

## 2023-05-25 ENCOUNTER — Other Ambulatory Visit: Payer: Self-pay | Admitting: Nurse Practitioner

## 2023-05-25 DIAGNOSIS — E039 Hypothyroidism, unspecified: Secondary | ICD-10-CM | POA: Diagnosis not present

## 2023-05-25 DIAGNOSIS — M8580 Other specified disorders of bone density and structure, unspecified site: Secondary | ICD-10-CM

## 2023-05-25 DIAGNOSIS — M069 Rheumatoid arthritis, unspecified: Secondary | ICD-10-CM | POA: Diagnosis not present

## 2023-05-26 DIAGNOSIS — M47812 Spondylosis without myelopathy or radiculopathy, cervical region: Secondary | ICD-10-CM | POA: Diagnosis not present

## 2023-05-26 DIAGNOSIS — G894 Chronic pain syndrome: Secondary | ICD-10-CM | POA: Diagnosis not present

## 2023-05-26 DIAGNOSIS — M069 Rheumatoid arthritis, unspecified: Secondary | ICD-10-CM | POA: Diagnosis not present

## 2023-05-26 DIAGNOSIS — M47816 Spondylosis without myelopathy or radiculopathy, lumbar region: Secondary | ICD-10-CM | POA: Diagnosis not present

## 2023-06-23 ENCOUNTER — Other Ambulatory Visit: Payer: Self-pay

## 2023-06-23 ENCOUNTER — Other Ambulatory Visit (HOSPITAL_COMMUNITY): Payer: Self-pay

## 2023-06-23 MED ORDER — OXYCODONE HCL 10 MG PO TABS
10.0000 mg | ORAL_TABLET | ORAL | 0 refills | Status: DC | PRN
  Filled 2023-06-23: qty 180, 30d supply, fill #0

## 2023-06-23 MED ORDER — MORPHINE SULFATE ER 15 MG PO TBCR
15.0000 mg | EXTENDED_RELEASE_TABLET | Freq: Three times a day (TID) | ORAL | 0 refills | Status: DC
  Filled 2023-06-23: qty 90, 30d supply, fill #0

## 2023-07-21 ENCOUNTER — Other Ambulatory Visit (HOSPITAL_COMMUNITY): Payer: Self-pay

## 2023-07-21 MED ORDER — MORPHINE SULFATE ER 15 MG PO TBCR
15.0000 mg | EXTENDED_RELEASE_TABLET | Freq: Three times a day (TID) | ORAL | 0 refills | Status: DC
  Filled 2023-07-21: qty 90, 30d supply, fill #0

## 2023-07-21 MED ORDER — OXYCODONE HCL 10 MG PO TABS
10.0000 mg | ORAL_TABLET | ORAL | 0 refills | Status: DC | PRN
  Filled 2023-07-21: qty 180, 30d supply, fill #0

## 2023-08-22 ENCOUNTER — Other Ambulatory Visit (HOSPITAL_COMMUNITY): Payer: Self-pay

## 2023-08-22 MED ORDER — OXYCODONE HCL 10 MG PO TABS
10.0000 mg | ORAL_TABLET | ORAL | 0 refills | Status: AC | PRN
  Filled 2023-08-22: qty 180, 30d supply, fill #0

## 2023-08-22 MED ORDER — MORPHINE SULFATE ER 15 MG PO TBCR
15.0000 mg | EXTENDED_RELEASE_TABLET | Freq: Three times a day (TID) | ORAL | 0 refills | Status: AC
Start: 2023-08-22 — End: ?
  Filled 2023-08-22: qty 90, 30d supply, fill #0

## 2023-09-21 ENCOUNTER — Other Ambulatory Visit (HOSPITAL_COMMUNITY): Payer: Self-pay

## 2023-09-21 MED ORDER — MORPHINE SULFATE ER 15 MG PO TBCR
15.0000 mg | EXTENDED_RELEASE_TABLET | Freq: Three times a day (TID) | ORAL | 0 refills | Status: DC
  Filled 2023-10-20: qty 90, 30d supply, fill #0

## 2023-09-21 MED ORDER — OXYCODONE HCL 10 MG PO TABS
10.0000 mg | ORAL_TABLET | ORAL | 0 refills | Status: AC | PRN
  Filled 2023-10-20: qty 180, 30d supply, fill #0

## 2023-09-21 MED ORDER — OXYCODONE HCL 10 MG PO TABS
10.0000 mg | ORAL_TABLET | ORAL | 0 refills | Status: AC | PRN
  Filled 2023-09-21: qty 180, 30d supply, fill #0

## 2023-09-21 MED ORDER — MORPHINE SULFATE ER 15 MG PO TBCR
15.0000 mg | EXTENDED_RELEASE_TABLET | Freq: Three times a day (TID) | ORAL | 0 refills | Status: AC
  Filled 2023-09-21: qty 90, 30d supply, fill #0

## 2023-10-17 ENCOUNTER — Encounter: Payer: Self-pay | Admitting: Nurse Practitioner

## 2023-10-18 ENCOUNTER — Encounter: Payer: Self-pay | Admitting: Nurse Practitioner

## 2023-10-20 ENCOUNTER — Other Ambulatory Visit (HOSPITAL_COMMUNITY): Payer: Self-pay

## 2023-10-20 ENCOUNTER — Other Ambulatory Visit: Payer: Self-pay | Admitting: Nurse Practitioner

## 2023-10-20 ENCOUNTER — Other Ambulatory Visit: Payer: Self-pay | Admitting: Geriatric Medicine

## 2023-10-20 DIAGNOSIS — Z8051 Family history of malignant neoplasm of kidney: Secondary | ICD-10-CM

## 2023-10-27 ENCOUNTER — Other Ambulatory Visit

## 2023-11-17 ENCOUNTER — Other Ambulatory Visit (HOSPITAL_COMMUNITY): Payer: Self-pay

## 2023-11-17 ENCOUNTER — Other Ambulatory Visit: Payer: Self-pay

## 2023-11-17 MED ORDER — OXYCODONE HCL 10 MG PO TABS
10.0000 mg | ORAL_TABLET | ORAL | 0 refills | Status: DC | PRN
  Filled 2023-11-17: qty 180, 30d supply, fill #0

## 2023-11-17 MED ORDER — MORPHINE SULFATE ER 15 MG PO TBCR
15.0000 mg | EXTENDED_RELEASE_TABLET | Freq: Three times a day (TID) | ORAL | 0 refills | Status: DC
  Filled 2023-11-17: qty 90, 30d supply, fill #0

## 2023-12-14 ENCOUNTER — Other Ambulatory Visit (HOSPITAL_COMMUNITY): Payer: Self-pay

## 2023-12-14 MED ORDER — OXYCODONE HCL 10 MG PO TABS
10.0000 mg | ORAL_TABLET | ORAL | 0 refills | Status: AC | PRN
  Filled 2023-12-14: qty 180, 30d supply, fill #0

## 2023-12-14 MED ORDER — OXYCODONE HCL 10 MG PO TABS
10.0000 mg | ORAL_TABLET | ORAL | 0 refills | Status: DC | PRN
  Filled 2024-01-11: qty 180, 30d supply, fill #0

## 2023-12-14 MED ORDER — MORPHINE SULFATE ER 15 MG PO TBCR
15.0000 mg | EXTENDED_RELEASE_TABLET | Freq: Three times a day (TID) | ORAL | 0 refills | Status: AC
  Filled 2023-12-14: qty 90, 30d supply, fill #0

## 2023-12-14 MED ORDER — MORPHINE SULFATE ER 15 MG PO TBCR
15.0000 mg | EXTENDED_RELEASE_TABLET | Freq: Three times a day (TID) | ORAL | 0 refills | Status: DC
  Filled 2024-01-11: qty 90, 30d supply, fill #0

## 2024-01-09 ENCOUNTER — Other Ambulatory Visit (HOSPITAL_COMMUNITY): Payer: Self-pay

## 2024-01-11 ENCOUNTER — Other Ambulatory Visit (HOSPITAL_COMMUNITY): Payer: Self-pay

## 2024-01-11 ENCOUNTER — Other Ambulatory Visit: Payer: Self-pay

## 2024-02-08 ENCOUNTER — Other Ambulatory Visit (HOSPITAL_COMMUNITY): Payer: Self-pay

## 2024-02-08 MED ORDER — MORPHINE SULFATE ER 15 MG PO TBCR
15.0000 mg | EXTENDED_RELEASE_TABLET | Freq: Three times a day (TID) | ORAL | 0 refills | Status: AC
  Filled 2024-02-08 – 2024-02-09 (×2): qty 90, 30d supply, fill #0

## 2024-02-08 MED ORDER — OXYCODONE HCL 10 MG PO TABS
10.0000 mg | ORAL_TABLET | ORAL | 0 refills | Status: AC | PRN
  Filled 2024-02-08 – 2024-02-09 (×2): qty 180, 30d supply, fill #0

## 2024-02-09 ENCOUNTER — Other Ambulatory Visit (HOSPITAL_COMMUNITY): Payer: Self-pay

## 2024-02-10 ENCOUNTER — Other Ambulatory Visit
